# Patient Record
Sex: Male | Born: 1945 | Race: White | Hispanic: No | Marital: Single | Smoking: Never smoker
Health system: Southern US, Community
[De-identification: ages and names within clinical notes are randomized; demographics above are authoritative.]

## PROBLEM LIST (undated history)

## (undated) DIAGNOSIS — I10 Essential (primary) hypertension: Secondary | ICD-10-CM

## (undated) HISTORY — PX: KNEE SURGERY: SHX244

## (undated) HISTORY — DX: Essential (primary) hypertension: I10

---

## 1998-08-12 ENCOUNTER — Other Ambulatory Visit: Admission: RE | Admit: 1998-08-12 | Discharge: 1998-08-12 | Payer: Self-pay | Admitting: Orthopedic Surgery

## 2011-03-22 DIAGNOSIS — H524 Presbyopia: Secondary | ICD-10-CM | POA: Diagnosis not present

## 2011-11-16 DIAGNOSIS — L989 Disorder of the skin and subcutaneous tissue, unspecified: Secondary | ICD-10-CM | POA: Diagnosis not present

## 2011-11-16 DIAGNOSIS — E7800 Pure hypercholesterolemia, unspecified: Secondary | ICD-10-CM | POA: Diagnosis not present

## 2011-11-16 DIAGNOSIS — I10 Essential (primary) hypertension: Secondary | ICD-10-CM | POA: Diagnosis not present

## 2011-11-16 DIAGNOSIS — Z79899 Other long term (current) drug therapy: Secondary | ICD-10-CM | POA: Diagnosis not present

## 2011-11-16 DIAGNOSIS — Z23 Encounter for immunization: Secondary | ICD-10-CM | POA: Diagnosis not present

## 2011-11-16 DIAGNOSIS — Z1331 Encounter for screening for depression: Secondary | ICD-10-CM | POA: Diagnosis not present

## 2011-11-16 DIAGNOSIS — Z0000 Encounter for general adult medical examination without abnormal findings: Secondary | ICD-10-CM | POA: Diagnosis not present

## 2011-11-16 DIAGNOSIS — M1990 Unspecified osteoarthritis, unspecified site: Secondary | ICD-10-CM | POA: Diagnosis not present

## 2011-11-16 DIAGNOSIS — Z125 Encounter for screening for malignant neoplasm of prostate: Secondary | ICD-10-CM | POA: Diagnosis not present

## 2011-12-07 DIAGNOSIS — M19049 Primary osteoarthritis, unspecified hand: Secondary | ICD-10-CM | POA: Diagnosis not present

## 2011-12-30 DIAGNOSIS — R300 Dysuria: Secondary | ICD-10-CM | POA: Diagnosis not present

## 2012-02-16 DIAGNOSIS — C4359 Malignant melanoma of other part of trunk: Secondary | ICD-10-CM | POA: Diagnosis not present

## 2012-02-16 DIAGNOSIS — L570 Actinic keratosis: Secondary | ICD-10-CM | POA: Diagnosis not present

## 2012-02-16 DIAGNOSIS — L821 Other seborrheic keratosis: Secondary | ICD-10-CM | POA: Diagnosis not present

## 2012-02-16 DIAGNOSIS — D485 Neoplasm of uncertain behavior of skin: Secondary | ICD-10-CM | POA: Diagnosis not present

## 2012-02-16 DIAGNOSIS — C44319 Basal cell carcinoma of skin of other parts of face: Secondary | ICD-10-CM | POA: Diagnosis not present

## 2012-03-14 DIAGNOSIS — C4359 Malignant melanoma of other part of trunk: Secondary | ICD-10-CM | POA: Diagnosis not present

## 2012-03-14 DIAGNOSIS — D485 Neoplasm of uncertain behavior of skin: Secondary | ICD-10-CM | POA: Diagnosis not present

## 2012-03-21 DIAGNOSIS — C44319 Basal cell carcinoma of skin of other parts of face: Secondary | ICD-10-CM | POA: Diagnosis not present

## 2012-04-19 ENCOUNTER — Other Ambulatory Visit: Payer: Self-pay | Admitting: Geriatric Medicine

## 2012-04-19 DIAGNOSIS — R1310 Dysphagia, unspecified: Secondary | ICD-10-CM | POA: Diagnosis not present

## 2012-04-19 DIAGNOSIS — E782 Mixed hyperlipidemia: Secondary | ICD-10-CM | POA: Diagnosis not present

## 2012-04-19 DIAGNOSIS — I10 Essential (primary) hypertension: Secondary | ICD-10-CM | POA: Diagnosis not present

## 2012-04-19 DIAGNOSIS — Z79899 Other long term (current) drug therapy: Secondary | ICD-10-CM | POA: Diagnosis not present

## 2012-04-19 DIAGNOSIS — Z125 Encounter for screening for malignant neoplasm of prostate: Secondary | ICD-10-CM | POA: Diagnosis not present

## 2012-04-19 DIAGNOSIS — K219 Gastro-esophageal reflux disease without esophagitis: Secondary | ICD-10-CM | POA: Diagnosis not present

## 2012-04-19 DIAGNOSIS — Z0000 Encounter for general adult medical examination without abnormal findings: Secondary | ICD-10-CM | POA: Diagnosis not present

## 2012-04-19 DIAGNOSIS — Z1331 Encounter for screening for depression: Secondary | ICD-10-CM | POA: Diagnosis not present

## 2012-05-10 ENCOUNTER — Other Ambulatory Visit: Payer: Self-pay

## 2012-05-14 ENCOUNTER — Ambulatory Visit
Admission: RE | Admit: 2012-05-14 | Discharge: 2012-05-14 | Disposition: A | Discharge status: Home / Self Care | Payer: Medicare Other | Source: Ambulatory Visit | Attending: Geriatric Medicine | Admitting: Geriatric Medicine

## 2012-05-14 DIAGNOSIS — K449 Diaphragmatic hernia without obstruction or gangrene: Secondary | ICD-10-CM | POA: Diagnosis not present

## 2012-05-14 DIAGNOSIS — R1310 Dysphagia, unspecified: Secondary | ICD-10-CM

## 2012-07-05 DIAGNOSIS — D045 Carcinoma in situ of skin of trunk: Secondary | ICD-10-CM | POA: Diagnosis not present

## 2012-07-05 DIAGNOSIS — L821 Other seborrheic keratosis: Secondary | ICD-10-CM | POA: Diagnosis not present

## 2012-07-05 DIAGNOSIS — Q828 Other specified congenital malformations of skin: Secondary | ICD-10-CM | POA: Diagnosis not present

## 2012-07-05 DIAGNOSIS — D0460 Carcinoma in situ of skin of unspecified upper limb, including shoulder: Secondary | ICD-10-CM | POA: Diagnosis not present

## 2012-07-05 DIAGNOSIS — L570 Actinic keratosis: Secondary | ICD-10-CM | POA: Diagnosis not present

## 2012-07-05 DIAGNOSIS — Z85828 Personal history of other malignant neoplasm of skin: Secondary | ICD-10-CM | POA: Diagnosis not present

## 2012-07-05 DIAGNOSIS — D485 Neoplasm of uncertain behavior of skin: Secondary | ICD-10-CM | POA: Diagnosis not present

## 2012-07-05 DIAGNOSIS — Z85820 Personal history of malignant melanoma of skin: Secondary | ICD-10-CM | POA: Diagnosis not present

## 2012-10-18 DIAGNOSIS — E782 Mixed hyperlipidemia: Secondary | ICD-10-CM | POA: Diagnosis not present

## 2012-10-18 DIAGNOSIS — I10 Essential (primary) hypertension: Secondary | ICD-10-CM | POA: Diagnosis not present

## 2013-04-25 DIAGNOSIS — G479 Sleep disorder, unspecified: Secondary | ICD-10-CM | POA: Diagnosis not present

## 2013-04-25 DIAGNOSIS — Z23 Encounter for immunization: Secondary | ICD-10-CM | POA: Diagnosis not present

## 2013-04-25 DIAGNOSIS — Z1331 Encounter for screening for depression: Secondary | ICD-10-CM | POA: Diagnosis not present

## 2013-04-25 DIAGNOSIS — Z125 Encounter for screening for malignant neoplasm of prostate: Secondary | ICD-10-CM | POA: Diagnosis not present

## 2013-04-25 DIAGNOSIS — E7800 Pure hypercholesterolemia, unspecified: Secondary | ICD-10-CM | POA: Diagnosis not present

## 2013-04-25 DIAGNOSIS — Z79899 Other long term (current) drug therapy: Secondary | ICD-10-CM | POA: Diagnosis not present

## 2013-04-25 DIAGNOSIS — I10 Essential (primary) hypertension: Secondary | ICD-10-CM | POA: Diagnosis not present

## 2013-04-25 DIAGNOSIS — Z0000 Encounter for general adult medical examination without abnormal findings: Secondary | ICD-10-CM | POA: Diagnosis not present

## 2014-02-07 DIAGNOSIS — I10 Essential (primary) hypertension: Secondary | ICD-10-CM | POA: Diagnosis not present

## 2014-02-07 DIAGNOSIS — G4700 Insomnia, unspecified: Secondary | ICD-10-CM | POA: Diagnosis not present

## 2014-02-07 DIAGNOSIS — Z0000 Encounter for general adult medical examination without abnormal findings: Secondary | ICD-10-CM | POA: Diagnosis not present

## 2014-02-07 DIAGNOSIS — E780 Pure hypercholesterolemia: Secondary | ICD-10-CM | POA: Diagnosis not present

## 2014-02-07 DIAGNOSIS — Z125 Encounter for screening for malignant neoplasm of prostate: Secondary | ICD-10-CM | POA: Diagnosis not present

## 2014-07-09 DIAGNOSIS — D048 Carcinoma in situ of skin of other sites: Secondary | ICD-10-CM | POA: Diagnosis not present

## 2014-07-09 DIAGNOSIS — Z08 Encounter for follow-up examination after completed treatment for malignant neoplasm: Secondary | ICD-10-CM | POA: Diagnosis not present

## 2014-07-09 DIAGNOSIS — L579 Skin changes due to chronic exposure to nonionizing radiation, unspecified: Secondary | ICD-10-CM | POA: Diagnosis not present

## 2014-07-09 DIAGNOSIS — Z85820 Personal history of malignant melanoma of skin: Secondary | ICD-10-CM | POA: Diagnosis not present

## 2014-07-09 DIAGNOSIS — C4492 Squamous cell carcinoma of skin, unspecified: Secondary | ICD-10-CM | POA: Diagnosis not present

## 2014-07-30 DIAGNOSIS — C44399 Other specified malignant neoplasm of skin of other parts of face: Secondary | ICD-10-CM | POA: Diagnosis not present

## 2014-07-30 DIAGNOSIS — D0439 Carcinoma in situ of skin of other parts of face: Secondary | ICD-10-CM | POA: Diagnosis not present

## 2014-08-06 DIAGNOSIS — L570 Actinic keratosis: Secondary | ICD-10-CM | POA: Diagnosis not present

## 2014-08-06 DIAGNOSIS — L821 Other seborrheic keratosis: Secondary | ICD-10-CM | POA: Diagnosis not present

## 2014-08-06 DIAGNOSIS — Z85820 Personal history of malignant melanoma of skin: Secondary | ICD-10-CM | POA: Diagnosis not present

## 2014-08-06 DIAGNOSIS — L579 Skin changes due to chronic exposure to nonionizing radiation, unspecified: Secondary | ICD-10-CM | POA: Diagnosis not present

## 2014-08-06 DIAGNOSIS — Z08 Encounter for follow-up examination after completed treatment for malignant neoplasm: Secondary | ICD-10-CM | POA: Diagnosis not present

## 2014-09-17 DIAGNOSIS — L570 Actinic keratosis: Secondary | ICD-10-CM | POA: Diagnosis not present

## 2014-09-17 DIAGNOSIS — Z85828 Personal history of other malignant neoplasm of skin: Secondary | ICD-10-CM | POA: Diagnosis not present

## 2014-09-17 DIAGNOSIS — L578 Other skin changes due to chronic exposure to nonionizing radiation: Secondary | ICD-10-CM | POA: Diagnosis not present

## 2014-09-19 DIAGNOSIS — M1990 Unspecified osteoarthritis, unspecified site: Secondary | ICD-10-CM | POA: Diagnosis not present

## 2014-09-19 DIAGNOSIS — F411 Generalized anxiety disorder: Secondary | ICD-10-CM | POA: Diagnosis not present

## 2014-09-19 DIAGNOSIS — R413 Other amnesia: Secondary | ICD-10-CM | POA: Diagnosis not present

## 2014-09-19 DIAGNOSIS — G4700 Insomnia, unspecified: Secondary | ICD-10-CM | POA: Diagnosis not present

## 2014-11-07 DIAGNOSIS — M25519 Pain in unspecified shoulder: Secondary | ICD-10-CM | POA: Diagnosis not present

## 2014-11-07 DIAGNOSIS — Z79899 Other long term (current) drug therapy: Secondary | ICD-10-CM | POA: Diagnosis not present

## 2014-11-07 DIAGNOSIS — F329 Major depressive disorder, single episode, unspecified: Secondary | ICD-10-CM | POA: Diagnosis not present

## 2014-11-07 DIAGNOSIS — I10 Essential (primary) hypertension: Secondary | ICD-10-CM | POA: Diagnosis not present

## 2014-11-11 DIAGNOSIS — M25512 Pain in left shoulder: Secondary | ICD-10-CM | POA: Diagnosis not present

## 2014-11-15 DIAGNOSIS — M25512 Pain in left shoulder: Secondary | ICD-10-CM | POA: Diagnosis not present

## 2014-11-17 DIAGNOSIS — M25512 Pain in left shoulder: Secondary | ICD-10-CM | POA: Diagnosis not present

## 2014-12-04 DIAGNOSIS — R531 Weakness: Secondary | ICD-10-CM | POA: Diagnosis not present

## 2014-12-04 DIAGNOSIS — M25512 Pain in left shoulder: Secondary | ICD-10-CM | POA: Diagnosis not present

## 2014-12-08 DIAGNOSIS — M25512 Pain in left shoulder: Secondary | ICD-10-CM | POA: Diagnosis not present

## 2014-12-08 DIAGNOSIS — R531 Weakness: Secondary | ICD-10-CM | POA: Diagnosis not present

## 2014-12-11 DIAGNOSIS — R531 Weakness: Secondary | ICD-10-CM | POA: Diagnosis not present

## 2014-12-11 DIAGNOSIS — M25512 Pain in left shoulder: Secondary | ICD-10-CM | POA: Diagnosis not present

## 2014-12-15 DIAGNOSIS — R531 Weakness: Secondary | ICD-10-CM | POA: Diagnosis not present

## 2014-12-15 DIAGNOSIS — F329 Major depressive disorder, single episode, unspecified: Secondary | ICD-10-CM | POA: Diagnosis not present

## 2014-12-15 DIAGNOSIS — I10 Essential (primary) hypertension: Secondary | ICD-10-CM | POA: Diagnosis not present

## 2014-12-15 DIAGNOSIS — M25512 Pain in left shoulder: Secondary | ICD-10-CM | POA: Diagnosis not present

## 2014-12-15 DIAGNOSIS — M25519 Pain in unspecified shoulder: Secondary | ICD-10-CM | POA: Diagnosis not present

## 2015-01-05 DIAGNOSIS — M25512 Pain in left shoulder: Secondary | ICD-10-CM | POA: Diagnosis not present

## 2015-01-07 DIAGNOSIS — M25512 Pain in left shoulder: Secondary | ICD-10-CM | POA: Diagnosis not present

## 2015-01-07 DIAGNOSIS — R531 Weakness: Secondary | ICD-10-CM | POA: Diagnosis not present

## 2015-01-13 DIAGNOSIS — M25512 Pain in left shoulder: Secondary | ICD-10-CM | POA: Diagnosis not present

## 2015-01-13 DIAGNOSIS — R531 Weakness: Secondary | ICD-10-CM | POA: Diagnosis not present

## 2015-01-21 DIAGNOSIS — R531 Weakness: Secondary | ICD-10-CM | POA: Diagnosis not present

## 2015-01-21 DIAGNOSIS — M25512 Pain in left shoulder: Secondary | ICD-10-CM | POA: Diagnosis not present

## 2015-01-27 DIAGNOSIS — R531 Weakness: Secondary | ICD-10-CM | POA: Diagnosis not present

## 2015-01-27 DIAGNOSIS — M25512 Pain in left shoulder: Secondary | ICD-10-CM | POA: Diagnosis not present

## 2015-02-04 DIAGNOSIS — R531 Weakness: Secondary | ICD-10-CM | POA: Diagnosis not present

## 2015-02-04 DIAGNOSIS — M25512 Pain in left shoulder: Secondary | ICD-10-CM | POA: Diagnosis not present

## 2015-02-09 DIAGNOSIS — M25512 Pain in left shoulder: Secondary | ICD-10-CM | POA: Diagnosis not present

## 2015-02-17 ENCOUNTER — Ambulatory Visit (INDEPENDENT_AMBULATORY_CARE_PROVIDER_SITE_OTHER): Payer: Medicare Other | Admitting: Neurology

## 2015-02-17 ENCOUNTER — Other Ambulatory Visit: Payer: Self-pay | Admitting: *Deleted

## 2015-02-17 DIAGNOSIS — M6281 Muscle weakness (generalized): Secondary | ICD-10-CM

## 2015-02-17 DIAGNOSIS — G540 Brachial plexus disorders: Secondary | ICD-10-CM

## 2015-02-17 DIAGNOSIS — M6258 Muscle wasting and atrophy, not elsewhere classified, other site: Secondary | ICD-10-CM

## 2015-02-17 NOTE — Procedures (Signed)
[facility]  [address]  [place], [place] [address] Tel: [phone] Fax:  [phone] Test Date:  02/17/2015  Patient: Oscar Thompson DOB: 01/17/1946 Physician: Narda Amber, DO  Sex: Male Height: 5\' 6"  Ref Phys: Flossie Dibble, M.D.  ID#: [id number] Temp: 35.5C Technician: Jerilynn Mages. Dean   Patient Complaints: This is a 70 year-old gentleman referred for evaluation of proximal arm weakness on the left, concerning for axillary nerve injury.  NCV & EMG Findings: Extensive electrodiagnostic testing of the left upper extremity, thoracic paraspinal muscles (T7 and T11 levels), and additional studies of the right shows: 1. All sensory responses including the left median, ulnar, radial, medial and lateral antebrachial cutaneous sensory responses are within normal limits. 2. All motor responses are diffusely reduced in amplitude including the left median, ulnar (ADM and FDI), radial and bilateral axillary nerves. 3. There is severe active on chronic motor axon loss changes are seen affecting all of the tested muscles of the upper extremity on the left with prominent generalized atrophy of all the muscle groups. In comparison, there is chronic motor axon loss changes affecting all the muscles tested in the right upper extremity with fibrillations isolated to the pronator teres muscle. 4. Active motor axon loss changes are present in the thoracic paraspinal muscles at T7. 5. Fasciculation potentials are seen affecting 8 of the 18 tested muscles.  Impression: Extensive electrodiagnostic examination of the upper extremities and mid thoracic paraspinal muscles reveal active and chronic motor axon loss changes affecting virtually every muscle studied in the left upper extremity and thoracic paraspinal muscles. There is also evidence of a widespread fasciculation potentials.    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  Additional electrodiagnostic testing and neurological consultation is recommended.    ___________________________ Narda Amber, DO    Nerve Conduction Studies Anti Sensory Summary Table   Site NR Peak (ms) Norm Peak (ms) P-T Amp (V) Norm P-T Amp  Left Lat Ante Brach Cutan Anti Sensory (Lat Forearm)  35.5C  Lat Biceps    2.0 <2.8 12.8 >10  Left Med Ante Brach Cutan Anti Sensory (Med Forearm)  35.5C  Elbow    2.1  6.7   Left Median Anti Sensory (2nd Digit)  35.5C  Wrist    3.3 <3.8 10.9 >10  Left Radial Anti Sensory (Base 1st Digit)  35.5C  Wrist    2.1 <2.8 18.3 >10  Left Ulnar Anti Sensory (5th Digit)  35.5C  Wrist    3.0 <3.2 9.6 >5   Motor Summary Table   Site NR Onset (ms) Norm Onset (ms) O-P Amp (mV) Norm O-P Amp Site1 Site2 Delta-0 (ms) Dist (cm) Vel (m/s) Norm Vel (m/s)  Left Axillary Motor (Deltoid)  35.5C  Clavicle    5.5 <5.0 1.5 >3        Right Axillary Motor (Deltoid)  35.5C  Clavicle    5.8 <5.0 2.0 >3        Left Median Motor (Abd Poll Brev)  35.5C  Wrist    3.2 <4.0 2.9 >5 Elbow Wrist 5.7 29.0 51 >50  Elbow    8.9  2.9         Left Radial Motor (Ext Ind Prop)  35.5C  7cm    2.1 <3.1 2.2 >5 Up Arm 7cm 2.0 10.0 50 >50  Up Arm  4.1  2.1         Left Ulnar Motor (Abd Dig Minimi)  35.5C  Wrist    3.0 <3.1 4.3 >7 B Elbow Wrist 4.1 21.0 51 >50  B Elbow    7.1  3.9  A Elbow B Elbow 2.0 10.0 50 >50  A Elbow    9.1  3.9         Left Ulnar (FDI) Motor (1st DI)  35.5C  Wrist    4.3 <4.5 2.2 >7 Post-exercise Wrist 0.1 0.0  >50  Post-exercise    4.4  2.7          F Wave Studies   NR F-Lat (ms) Lat Norm (ms) L-R F-Lat (ms)  Left Ulnar (Mrkrs) (Abd Dig Min)  35.5C     31.50 <33    EMG   Side Muscle Ins Act Fibs Psw Fasc Number Recrt Dur Dur. Amp Amp. Poly Poly. Comment  Left 1stDorInt Nml 1+ Nml Nml 2- Rapid Many 1+  Many 1+ Nml Nml ATR  Left Abd Poll Brev Nml 1+ Nml Nml 3- Rapid Many 1+ Many 1+ Nml Nml ATR  Left ABD Dig Min Nml 1+ Nml Nml 3- Rapid All 1+ Many 1+ Nml Nml ATR  Left Ext Indicis Nml 1+ Nml Nml 3- Rapid Many 1+ Many 1+ Nml Nml ATR  Left PronatorTeres Nml 2+ Nml 1+ 2- Rapid Many 1+ Many 1+ Nml Nml ATR  Left Biceps Nml 2+ Nml 1+ 3- Rapid Most 1+ Most 1+ Some 1+ ATR  Left Triceps Nml 2+ Nml 2+ 2- Rapid Many 1+ Many 1+ Nml Nml ATR  Left Deltoid Nml 1+ Nml 1+ 3- Rapid All 1+ All 1+ All 1+ ATR  Left Infraspinatus Nml 3+ Nml 1+ NE None - - - - - - ATR  Left Thoracic Parasp Mid Nml 2+ Nml 1+ NE - - - - - - - ATR  Left Thoracic Parasp Low Nml Nml Nml Nml NE - - - - - - - ATR  Right 1stDorInt Nml Nml Nml Nml 2- Rapid Many 1+ Many 1+ Many Nml ATR  Right Abd Poll Brev Nml Nml Nml Nml 3- Rapid Many 1+ Some 1+ Nml Nml ATR  Right Ext Indicis Nml Nml Nml Nml 2- Rapid Many 1+ Many 1+ Nml Nml ATR  Right PronatorTeres Nml 2+ Nml 1+ 2- Rapid Some 1+ Some 1+ Nml Nml ATR  Right Biceps Nml Nml Nml Nml 2- Rapid Most 1+ Most 1+ Nml Nml ATR  Right Triceps Nml Nml Nml 3+ 3- Rapid Most 1+ Most 1+ Nml Nml ATR  Right Deltoid Nml Nml Nml Nml 3- Rapid Most 1+ Most 1+ Nml Nml ATR  Waveforms:

## 2015-02-23 ENCOUNTER — Ambulatory Visit (INDEPENDENT_AMBULATORY_CARE_PROVIDER_SITE_OTHER): Payer: Medicare Other | Admitting: Neurology

## 2015-02-23 ENCOUNTER — Encounter: Payer: Self-pay | Admitting: Neurology

## 2015-02-23 ENCOUNTER — Other Ambulatory Visit (INDEPENDENT_AMBULATORY_CARE_PROVIDER_SITE_OTHER): Payer: Medicare Other

## 2015-02-23 VITALS — BP 150/80 | HR 79 | Ht 63.0 in | Wt 138.2 lb

## 2015-02-23 DIAGNOSIS — R634 Abnormal weight loss: Secondary | ICD-10-CM

## 2015-02-23 DIAGNOSIS — R531 Weakness: Secondary | ICD-10-CM | POA: Diagnosis not present

## 2015-02-23 DIAGNOSIS — G1220 Motor neuron disease, unspecified: Secondary | ICD-10-CM | POA: Diagnosis not present

## 2015-02-23 DIAGNOSIS — G8331 Monoplegia, unspecified affecting right dominant side: Secondary | ICD-10-CM | POA: Diagnosis not present

## 2015-02-23 DIAGNOSIS — M6250 Muscle wasting and atrophy, not elsewhere classified, unspecified site: Secondary | ICD-10-CM | POA: Diagnosis not present

## 2015-02-23 DIAGNOSIS — G8334 Monoplegia, unspecified affecting left nondominant side: Secondary | ICD-10-CM

## 2015-02-23 DIAGNOSIS — R253 Fasciculation: Secondary | ICD-10-CM

## 2015-02-23 LAB — COMPREHENSIVE METABOLIC PANEL
ALT: 13 U/L (ref 9–46)
AST: 16 U/L (ref 10–35)
Albumin: 3.7 g/dL (ref 3.6–5.1)
Alkaline Phosphatase: 58 U/L (ref 40–115)
BUN: 14 mg/dL (ref 7–25)
CHLORIDE: 101 mmol/L (ref 98–110)
CO2: 29 mmol/L (ref 20–31)
Calcium: 8.8 mg/dL (ref 8.6–10.3)
Creat: 0.53 mg/dL — ABNORMAL LOW (ref 0.70–1.18)
Glucose, Bld: 89 mg/dL (ref 65–99)
POTASSIUM: 4.7 mmol/L (ref 3.5–5.3)
SODIUM: 139 mmol/L (ref 135–146)
TOTAL PROTEIN: 6.2 g/dL (ref 6.1–8.1)
Total Bilirubin: 0.3 mg/dL (ref 0.2–1.2)

## 2015-02-23 LAB — CK: CK TOTAL: 272 U/L — AB (ref 7–232)

## 2015-02-23 LAB — VITAMIN B12: Vitamin B-12: 858 pg/mL (ref 211–911)

## 2015-02-23 LAB — TSH: TSH: 2.68 u[IU]/mL (ref 0.35–4.50)

## 2015-02-23 NOTE — Progress Notes (Signed)
[facility] Clinic Note - Initial Visit   Date: 02/23/2015  Oscar Thompson MRN: [id number] DOB: 09-26-1945   Dear Dr. French Ana:  Thank you for your kind referral of Oscar Thompson for consultation of bilateral arm weakness and atrophy. Although his history is well known to you, please allow [place] to reiterate it for the purpose of our medical record. The patient was accompanied to the clinic by self.   History of Present Illness: Oscar Thompson is a 70 y.o. right-handed Caucasian male with hypertension, skin cancer, arthritis, GERD, and depression presenting for evaluation of generalized muscle atrophy and arm weakness.    He has always been very active and has his own [place] and previously used to teach martial arts and enjoys dancing the [place].  Starting around summer 2016, he began noticing weakness when trying to lift objects and raise his arm above his head.  He has never has any pain.   Since there, there has been a gradual worsening of weakness and during the holidays began noticing similar symptoms in the right arm.   He takes potassium for muscle cramps.  He endorses weakness of the legs such as that he is unable to climb stairs as effortlessly as previously.  He has lost 35lb over the past 29-month.  Since early 2016, he recalls having difficulty swallowing solids and takes omeprazole for GERD which helps.  He has noticed muscle twitches over his arms, but they are not bothersome.  He denies any shortness of breath and sleeps on one pillow.   He was seeing Dr. CFrench Anafor shoulder weakness and had MRI shoulder which did not explain the severity of symptoms, so referred to me for NCS/EMG.  Electrodiagnostic testing showed severe active on chronic motor axon loss changes, concerning for disorder of anterior horn cells.  He is here for further evaluation and management.    Over last week, he has noticed progressively worsening weakness of  the arms, such that and now he is able to feed himself because of inability to bring the utensils to his mouth.  Activities such as cooking, shaving, brushing teeth, and dressing are all much more challenging.       Out-side paper records, electronic medical record, and images have been reviewed where available and summarized as:  NCS/EMG of the upper extremities 02/22/2014: Extensive electrodiagnostic examination of the upper extremities and mid thoracic paraspinal muscles reveal active and chronic motor axon loss changes affecting virtually every muscle studied in the left upper extremity and thoracic paraspinal muscles. There is also evidence of a widespread fasciculation potentials.   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  Additional electrodiagnostic testing and neurological consultation is recommended.    Past Medical History  Diagnosis Date  . Hypertension     Past Surgical History  Procedure Laterality Date  . Knee surgery       Medications:  Outpatient Encounter Prescriptions as of 02/23/2015  Medication Sig  . aspirin-acetaminophen-caffeine (EXCEDRIN MIGRAINE) 250-250-65 MG tablet Take by mouth every 6 (six) hours as needed for headache.  . citalopram (CELEXA) 10 MG tablet Take 10 mg by mouth daily.  . diclofenac (VOLTAREN) 25 MG EC tablet Take 25 mg by mouth 2 (two) times daily.  .Marland KitchenLOSARTAN POTASSIUM-HCTZ PO Take by mouth.   No facility-administered encounter medications on file  as of 02/23/2015.     Allergies:  Allergies  Allergen Reactions  . Sulfa Antibiotics     Family History: Family History  Problem Relation Age of Onset  . CAD Mother     Deceased  . Transient ischemic attack Mother   . Stroke Mother   . Diabetes Mellitus II Father   . Arthritis  Sister   . Healthy Daughter     x 2    Social History: Social History  Substance Use Topics  . Smoking status: Never Smoker   . Smokeless tobacco: Never Used  . 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  He has a small handyman businees.   Lives alone in three story home.   Has 2 daughters.     Recently got out of a relationship.    Review of Systems:  CONSTITUTIONAL: No fevers, chills, night sweats, + 30 lb weight loss.   EYES: No visual changes or eye pain ENT: No hearing changes.  No history of nose bleeds.   RESPIRATORY: No cough, wheezing and shortness of breath.   CARDIOVASCULAR: Negative for chest pain, and palpitations.   GI: Negative for abdominal discomfort, blood in stools or black stools.  No recent change in bowel habits.   GU:  No history of incontinence.   MUSCLOSKELETAL: No history of joint pain or swelling.  No myalgias.   SKIN: Negative for lesions, rash, and itching.   HEMATOLOGY/ONCOLOGY: Negative for prolonged bleeding, bruising easily, and swollen nodes.  No history of cancer.   ENDOCRINE: Negative for cold or heat intolerance, polydipsia or goiter.   PSYCH:  +depression or anxiety symptoms.   NEURO: As Above.   Vital Signs:  BP 150/80 mmHg  Pulse 79  Wt 138 lb 4 oz (62.71 kg)  SpO2 96% Pain Scale: 0 on a scale of 0-10   General Medical Exam:   General:  Thin appearing, comfortable.   Eyes/ENT: see cranial nerve examination.   Neck: No masses appreciated.  Full range of motion without tenderness.  No carotid bruits. Respiratory:  Clear to auscultation, good air entry bilaterally.   Cardiac:  Regular rate and rhythm, no murmur.   Extremities:  No deformities, edema, or skin discoloration.  Skin:  No rashes or lesions.  Neurological Exam: MENTAL STATUS including orientation to time, place, person, recent and remote memory, attention span and concentration, language, and fund of  knowledge is normal.  Speech is not dysarthric.  CRANIAL NERVES: II:  No visual field defects.  Unremarkable fundi.   III-IV-VI: Pupils equal round and reactive to light.  Normal conjugate, extra-ocular eye movements in all directions of gaze.  No nystagmus.  No ptosis. V:  Normal facial sensation.  Jaw jerk is absent.   VII:  Normal facial symmetry and movements.  Mild weakness with buccinators. No pathologic facial reflexes.  VIII:  Normal hearing and vestibular function.   IX-X:  Normal palatal movement.   XI:  Normal shoulder shrug and head rotation.   XII:  Normal tongue strength and range of motion, no deviation.  Tongue fasciculations are present.  MOTOR:  Significant atrophy of the arms, especially shoulder girdle (L>R) and wasting of the thoracic muscles.  Active fasciculations over the proximal arm muscles, back, and rare in the lower legs.  No pronator drift.  Tone is normal.    Right Upper Extremity:    Left Upper Extremity:    Deltoid  3/5   Deltoid  3-/5   Biceps  3/5   Biceps  3-/5   Triceps  4/5   Triceps  3+/5   Wrist extensors  3/5   Wrist extensors  3-/5   Wrist flexors  5/5   Wrist flexors  4/5   Finger extensors  3/5   Finger extensors  3/5   Finger flexors  5-/5   Finger flexors  4/5   Dorsal interossei  4/5   Dorsal interossei  3/5   Abductor pollicis  4/5   Abductor pollicis  3/5   Tone (Ashworth scale)  0  Tone (Ashworth scale)  0   Right Lower Extremity:    Left Lower Extremity:    Hip flexors  5-/5   Hip flexors  5-/5   Hip extensors  5/5   Hip extensors  5/5   Knee flexors  5/5   Knee flexors  4+/5   Knee extensors  5/5   Knee extensors  5/5   Dorsiflexors  5/5   Dorsiflexors  5/5   Plantarflexors  5/5   Plantarflexors  5/5   Toe extensors  5/5   Toe extensors  5/5   Toe flexors  5/5   Toe flexors  5/5   Tone (Ashworth scale)  0  Tone (Ashworth scale)  0   MSRs:  Right                                                                  Left brachioradialis 2+  brachioradialis 2+  biceps 2+  biceps 2+  triceps 2+  triceps 2+  patellar 3+  patellar 3+  ankle jerk 2+  ankle jerk 2+  Hoffman no  Hoffman no  plantar response down  plantar response down  Crossed adductor present bilaterally  SENSORY:  Normal and symmetric perception of light touch, pinprick, vibration, and proprioception.   COORDINATION/GAIT: Normal finger-to- nose-finger and heel-to-shin.  Intact rapid alternating movements bilaterally.  Able to rise from a chair without using arms.  Gait narrow based and stable. Tandem and stressed gait intact.    IMPRESSION: Mr. Middlebrooks is a 70 year-old gentleman referred for evaluation of painless muscle weakness, atrophy, and fasciculations, which has been progressive since fall of 2016. 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 Need to exclude secondary causes of extrinsic injury to the motor nerves such as nutrient deficiency, paraproteinemias, and Lyme disease. Imaging of the brain and cervical spine will be ordered to evaluation of structural pathology affecting the upper motor neurons, but my overall suspicion is low. Finally, NCS/EMG will be performed of the lower extremity to assess the degree of involvement. I had a lengthy discussion about the diagnostic possibilities, including amyotrophic lateral sclerosis, but that it is too early to make any definitive diagnosis without further investigation.     PLAN/RECOMMENDATIONS:  1.  Check CK, TSH, copper, aldolase, SPEP with IFE, Lyme, vitamin B12, ESR 2.  MRI brain and cervical spine wo contrast 3.  NCS/EMG of the legs 4.  Home PT/OT declined  Return to clinic 4 weeks   The duration of this appointment visit was 60 minutes of face-to-face time with the patient.  Greater than 50% of this time was spent  in counseling, explanation of diagnosis, planning of further management, and coordination of care.   Thank you for  allowing me to participate in patient's care.  If I can answer any additional questions, I would be pleased to do so.    Sincerely,    Milia Warth K. Posey Pronto, DO

## 2015-02-23 NOTE — Progress Notes (Signed)
Note routed to both. 

## 2015-02-23 NOTE — Patient Instructions (Addendum)
1.  MRI brain and cervical spine 2.  NCS/EMG of the legs 3.  Check blood work 4.  Return to clinic after above testing

## 2015-02-24 LAB — LYME AB/WESTERN BLOT REFLEX: B BURGDORFERI AB IGG+ IGM: 0.28 {ISR}

## 2015-02-25 LAB — PROTEIN ELECTROPHORESIS, SERUM
ALBUMIN ELP: 3.5 g/dL — AB (ref 3.8–4.8)
ALPHA-1-GLOBULIN: 0.4 g/dL — AB (ref 0.2–0.3)
Alpha-2-Globulin: 0.9 g/dL (ref 0.5–0.9)
Beta 2: 0.4 g/dL (ref 0.2–0.5)
Beta Globulin: 0.4 g/dL (ref 0.4–0.6)
Gamma Globulin: 0.7 g/dL — ABNORMAL LOW (ref 0.8–1.7)
TOTAL PROTEIN, SERUM ELECTROPHOR: 6.2 g/dL (ref 6.1–8.1)

## 2015-02-25 LAB — ALDOLASE: ALDOLASE: 4.9 U/L (ref <=8.1)

## 2015-02-25 LAB — IMMUNOFIXATION ELECTROPHORESIS
IGA: 249 mg/dL (ref 68–379)
IGG (IMMUNOGLOBIN G), SERUM: 700 mg/dL (ref 650–1600)
IgM, Serum: 36 mg/dL — ABNORMAL LOW (ref 41–251)

## 2015-02-26 ENCOUNTER — Other Ambulatory Visit: Payer: Medicare Other

## 2015-02-26 ENCOUNTER — Ambulatory Visit
Admission: RE | Admit: 2015-02-26 | Discharge: 2015-02-26 | Disposition: A | Discharge status: Home / Self Care | Payer: Medicare Other | Source: Ambulatory Visit | Attending: Neurology | Admitting: Neurology

## 2015-02-26 DIAGNOSIS — R253 Fasciculation: Secondary | ICD-10-CM

## 2015-02-26 DIAGNOSIS — M50222 Other cervical disc displacement at C5-C6 level: Secondary | ICD-10-CM | POA: Diagnosis not present

## 2015-02-26 DIAGNOSIS — R634 Abnormal weight loss: Secondary | ICD-10-CM

## 2015-02-26 DIAGNOSIS — R531 Weakness: Secondary | ICD-10-CM

## 2015-02-26 DIAGNOSIS — G8331 Monoplegia, unspecified affecting right dominant side: Secondary | ICD-10-CM

## 2015-02-26 DIAGNOSIS — G8334 Monoplegia, unspecified affecting left nondominant side: Secondary | ICD-10-CM

## 2015-02-26 DIAGNOSIS — M6250 Muscle wasting and atrophy, not elsewhere classified, unspecified site: Secondary | ICD-10-CM

## 2015-02-26 DIAGNOSIS — G1220 Motor neuron disease, unspecified: Secondary | ICD-10-CM

## 2015-02-26 DIAGNOSIS — M50221 Other cervical disc displacement at C4-C5 level: Secondary | ICD-10-CM | POA: Diagnosis not present

## 2015-02-26 DIAGNOSIS — M50223 Other cervical disc displacement at C6-C7 level: Secondary | ICD-10-CM | POA: Diagnosis not present

## 2015-02-26 LAB — COPPER, SERUM: Copper: 136 ug/dL (ref 70–175)

## 2015-02-26 LAB — IMMUNOFIXATION INTE

## 2015-02-27 ENCOUNTER — Other Ambulatory Visit: Payer: Medicare Other

## 2015-03-02 LAB — PROTEIN ELECTROPHORESIS,RANDOM URN
ALBUMIN UR 24 HR ELECTRO: 13.8 %
ALPHA-1-GLOBULIN, U: 33 %
Alpha-2-Globulin, U: 31 %
BETA GLOBULIN, U: 14.7 %
CREATININE, URINE: 110 mg/dL (ref 20–370)
GAMMA GLOBULIN, U: 7.5 %
Protein Creatinine Ratio: 373 mg/g creat — ABNORMAL HIGH (ref 22–128)
Total Protein, Urine: 41 mg/dL — ABNORMAL HIGH (ref 5–25)

## 2015-03-03 ENCOUNTER — Ambulatory Visit (INDEPENDENT_AMBULATORY_CARE_PROVIDER_SITE_OTHER): Payer: Medicare Other | Admitting: Neurology

## 2015-03-03 DIAGNOSIS — G8334 Monoplegia, unspecified affecting left nondominant side: Secondary | ICD-10-CM

## 2015-03-03 DIAGNOSIS — R531 Weakness: Secondary | ICD-10-CM

## 2015-03-03 DIAGNOSIS — R253 Fasciculation: Secondary | ICD-10-CM

## 2015-03-03 DIAGNOSIS — G8331 Monoplegia, unspecified affecting right dominant side: Secondary | ICD-10-CM

## 2015-03-03 DIAGNOSIS — G1220 Motor neuron disease, unspecified: Secondary | ICD-10-CM

## 2015-03-03 DIAGNOSIS — M6250 Muscle wasting and atrophy, not elsewhere classified, unspecified site: Secondary | ICD-10-CM

## 2015-03-03 DIAGNOSIS — R634 Abnormal weight loss: Secondary | ICD-10-CM

## 2015-03-03 NOTE — Procedures (Signed)
[facility]  [address], [address]  [place], [place] [address] Tel: [phone] Fax:  [phone] Test Date:  03/03/2015  Patient: Oscar Thompson DOB: 25-Jul-1945 Physician: Narda Amber, DO  Sex: Male Height: 5\' 6"  Ref Phys: Narda Amber, DO  ID#: [id number] Temp: 33.2C Technician: Jerilynn Mages. Dean   Patient Complaints: This is a 70 year old gentleman referred for evaluation of progressive generalized weakness and fasciculations.  NCV & EMG Findings: Extensive electrodiagnostic testing of the left lower extremity, right lower extremity and bulbar muscles shows: 1. Bilateral sural and superficial peroneal sensory responses are within normal limits. 2. Bilateral peroneal and tibial motor responses are within normal limits. 3. Bilateral H reflex studies are within normal limits. 4. Active on chronic motor axon loss changes are seen affecting the rectus femoris and adductor longus muscles on the left. These findings are not seen in the right lower extremity.  5. Fasciculation potentials are seen affecting the lumbar paraspinal muscles, rectus femoris, abductor longus, and mentalis muscles on the left. 6. There is no evidence of active or chronic motor axon loss changes affecting the hyoglossus and mentalis muscles.  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 Overall, these findings are mild in degree electrically.   ___________________________ Narda Amber, DO    Nerve Conduction Studies Anti Sensory Summary Table   Site NR Peak (ms) Norm Peak (ms) P-T Amp (V) Norm P-T Amp  Left Sup Peroneal Anti Sensory (Ant Lat Mall)  33.2C  12 cm    2.6 <4.6 11.5 >3  Right Sup Peroneal Anti Sensory (Ant Lat Mall)  12 cm    4.2 <4.6 8.1 >3  Left Sural Anti Sensory (Lat Mall)  Calf    3.9 <4.6 6.4 >3  Right Sural Anti  Sensory (Lat Mall)  Calf    3.8 <4.6 3.5 >3   Motor Summary Table   Site NR Onset (ms) Norm Onset (ms) O-P Amp (mV) Norm O-P Amp Site1 Site2 Delta-0 (ms) Dist (cm) Vel (m/s) Norm Vel (m/s)  Left Peroneal Motor (Ext Dig Brev)  33.2C  Ankle    3.8 <6.0 4.2 >2.5 B Fib Ankle 6.2 30.0 48 >40  B Fib    10.0  3.5  Poplt B Fib 2.1 10.0 48 >40  Poplt    12.1  3.3         Right Peroneal Motor (Ext Dig Brev)  Ankle    4.5 <6.0 5.1 >2.5 B Fib Ankle 7.4 31.0 42 >40  B Fib    11.9  4.5  Poplt B Fib 2.2 10.0 45 >40  Poplt    14.1  4.4         Left Tibial Motor (Abd Hall Brev)  33.2C  Ankle    4.5 <6.0 4.8 >4 Knee Ankle 8.5 38.0 45 >40  Knee    13.0  4.6         Right Tibial Motor (Abd Hall Brev)  Ankle    4.0 <6.0 5.1 >4 Knee Ankle 9.6 40.0 42 >40  Knee    13.6  3.4           H Reflex Studies  NR H-Lat (ms) Lat Norm (ms) L-R H-Lat (ms)  Left Tibial (Gastroc)  33.2C     34.01 <35 0.96  Right Tibial (Gastroc)     34.97 <35 0.96   EMG  Side Muscle Ins Act Fibs Psw Fasc Number Recrt Dur Dur. Amp Amp. Poly Poly. Comment  Right RectFemoris Nml Nml Nml Nml Nml Nml Nml Nml Nml Nml Nml Nml N/A  Right GluteusMed Nml Nml Nml Nml Nml Nml Nml Nml Nml Nml Nml Nml N/A  Right Lumbo Parasp Low Nml Nml Nml 1+ Nml - - - - - - - N/A  Right AntTibialis Nml Nml Nml Nml Nml Nml Nml Nml Nml Nml Nml Nml N/A  Right Gastroc Nml Nml Nml Nml Nml Nml Nml Nml Nml Nml Nml Nml N/A  Left RectFemoris Nml 1+ Nml 1+ 1- Rapid Some 1+ Some 1+ Some Nml N/A  Left AntTibialis Nml Nml Nml Nml Nml Nml Nml Nml Nml Nml Nml Nml N/A  Left Gastroc Nml Nml Nml Nml Nml Nml Nml Nml Nml Nml Nml Nml N/A  Left Flex Dig Long Nml Nml Nml Nml Nml Nml Nml Nml Nml Nml Nml Nml N/A  Left GluteusMed Nml Nml Nml Nml Nml Nml Nml Nml Nml Nml Nml Nml N/A  Left AdductorLong Nml 1+ Nml 1+ 1- Rapid Some 1+ Some 1+ Nml Nml N/A  Left Mentalis Nml Nml Nml 1+ Nml Nml Nml Nml Nml Nml Nml Nml N/A  Left Hyoglossus Nml Nml Nml Nml Nml Nml Nml Nml Nml Nml Nml  Nml N/A      Waveforms:

## 2015-03-05 ENCOUNTER — Other Ambulatory Visit (INDEPENDENT_AMBULATORY_CARE_PROVIDER_SITE_OTHER): Payer: Medicare Other

## 2015-03-05 ENCOUNTER — Encounter: Payer: Self-pay | Admitting: Neurology

## 2015-03-05 ENCOUNTER — Ambulatory Visit (INDEPENDENT_AMBULATORY_CARE_PROVIDER_SITE_OTHER): Payer: Medicare Other | Admitting: Neurology

## 2015-03-05 VITALS — BP 122/62 | HR 62 | Ht 66.0 in | Wt 139.0 lb

## 2015-03-05 DIAGNOSIS — R253 Fasciculation: Secondary | ICD-10-CM

## 2015-03-05 DIAGNOSIS — G1221 Amyotrophic lateral sclerosis: Secondary | ICD-10-CM

## 2015-03-05 DIAGNOSIS — R531 Weakness: Secondary | ICD-10-CM

## 2015-03-05 DIAGNOSIS — M6250 Muscle wasting and atrophy, not elsewhere classified, unspecified site: Secondary | ICD-10-CM | POA: Diagnosis not present

## 2015-03-05 LAB — COMPREHENSIVE METABOLIC PANEL
ALBUMIN: 3.8 g/dL (ref 3.5–5.2)
ALT: 13 U/L (ref 0–53)
AST: 16 U/L (ref 0–37)
Alkaline Phosphatase: 64 U/L (ref 39–117)
BILIRUBIN TOTAL: 0.4 mg/dL (ref 0.2–1.2)
BUN: 15 mg/dL (ref 6–23)
CALCIUM: 9 mg/dL (ref 8.4–10.5)
CHLORIDE: 103 meq/L (ref 96–112)
CO2: 32 meq/L (ref 19–32)
CREATININE: 0.54 mg/dL (ref 0.40–1.50)
GFR: 159.78 mL/min (ref >60.00)
Glucose, Bld: 95 mg/dL (ref 70–99)
Potassium: 4.2 mEq/L (ref 3.5–5.1)
SODIUM: 140 meq/L (ref 135–145)
Total Protein: 6.6 g/dL (ref 6.0–8.3)

## 2015-03-05 LAB — CBC
HCT: 40.1 % (ref 39.0–52.0)
Hemoglobin: 13.5 g/dL (ref 13.0–17.0)
MCHC: 33.7 g/dL (ref 30.0–36.0)
MCV: 93.9 fl (ref 78.0–100.0)
PLATELETS: 309 10*3/uL (ref 150.0–400.0)
RBC: 4.27 Mil/uL (ref 4.22–5.81)
RDW: 13.2 % (ref 11.5–15.5)
WBC: 8.5 10*3/uL (ref 4.0–10.5)

## 2015-03-05 MED ORDER — RILUZOLE 50 MG PO TABS: ORAL_TABLET | ORAL | Status: AC

## 2015-03-05 NOTE — Progress Notes (Signed)
Follow-up Visit   Date: 03/05/2015    KIRILL BETIT MRN: [id number] DOB: 1945-07-24   Interim History: Oscar Thompson is a 70 y.o. right-handed Caucasian male with hypertension, skin cancer, arthritis, GERD, and depression returning to the clinic for follow-up of motor neuron disease.  The patient was accompanied to the clinic by self.  History of present illness: INITIAL VISIT 02/22/2014:  He has always been very active and has his own handyman business and previously used to teach martial arts and enjoys dancing the [place]. Starting around summer 2016, he began noticing weakness when trying to lift objects and raise his arm above his head. He has never has any pain. Since there, there has been a gradual worsening of weakness and during the holidays began noticing similar symptoms in the right arm. He takes potassium for muscle cramps. He endorses weakness of the legs such as that he is unable to climb stairs as effortlessly as previously. He has lost 35lb over the past 30-months. Since early 2016, he recalls having difficulty swallowing solids and takes omeprazole for GERD which helps. He has noticed muscle twitches over his arms, but they are not bothersome.  He was seeing Dr. French Ana for shoulder weakness and had MRI shoulder which did not explain the severity of symptoms, so referred to me for NCS/EMG. Electrodiagnostic testing showed severe active on chronic motor axon loss changes, concerning for disorder of anterior horn cells.   During the end of January, he has noticed progressively worsening weakness of the arms, such that and now he is able to feed himself because of inability to bring the utensils to his mouth. Activities such as cooking, shaving, brushing teeth, and dressing are all much more challenging.  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  UPDATE 03/05/2015:  Since his last visit, he has been  creative at home trying to find ways to manage his weakness, such as using a stool to place things in the microwave and putting items at waist level, as to avoid reaching overhead.  Denies any pain, shortness of breath, dysarthria, and dysphagia.  He is here to discuss the results of his MRI brain, MRI cervical spine, labs, and NCS/EMG.   He reports having a fall in Spring 2016 striking the cement on right side without loss of consciousness.   Medications:  Current Outpatient Prescriptions on File Prior to Visit  Medication Sig Dispense Refill  . aspirin-acetaminophen-caffeine (EXCEDRIN MIGRAINE) 250-250-65 MG tablet Take by mouth every 6 (six) hours as needed for headache.    . citalopram (CELEXA) 10 MG tablet Take 10 mg by mouth daily.    . diclofenac (VOLTAREN) 25 MG EC tablet Take 25 mg by mouth 2 (two) times daily.    Marland Kitchen LOSARTAN POTASSIUM-HCTZ PO Take by mouth.     No current facility-administered medications on file prior to visit.    Allergies:  Allergies  Allergen Reactions  . Sulfa Antibiotics     Review of Systems:  CONSTITUTIONAL: No fevers, chills, night sweats, +30 lb weight loss.  EYES: No visual changes or eye pain ENT: No hearing changes.  No history of nose bleeds.   RESPIRATORY: No cough, wheezing and shortness of breath.   CARDIOVASCULAR: Negative for chest pain, and palpitations.   GI: Negative for abdominal discomfort, blood in stools or black stools.  No recent change in bowel habits.   GU:  No history of incontinence.   MUSCLOSKELETAL: No  history of joint pain or swelling.  No myalgias.   SKIN: Negative for lesions, rash, and itching.   ENDOCRINE: Negative for cold or heat intolerance, polydipsia or goiter.   PSYCH:  + depression or anxiety symptoms.   NEURO: As Above.   Vital Signs:  BP 122/62 mmHg  Pulse 62  Ht 5\' 6"  (1.676 m)  Wt 139 lb (63.05 kg)  BMI 22.45 kg/m2  Neurological Exam: MENTAL STATUS including orientation to time, place, person,  recent and remote memory, attention span and concentration, language, and fund of knowledge is normal.  Speech is not dysarthric.  CRANIAL NERVES:  No visual field defects. Pupils equal round and reactive to light.  Normal conjugate, extra-ocular eye movements in all directions of gaze.  No ptosis. Normal facial sensation.  Face is symmetric. Palate elevates symmetrically.  Tongue is midline.Tongue fasciculations are present, but there is no tongue weakness.  Bilateral palmomental reflexes.  No snout.  Myerson's sign is present.  MOTOR:  Prominent atrophy of the arms, especially shoulder girdle (L>R) and wasting of the thoracic muscles. Active fasciculations over the proximal arm muscles, back, and rare in the lower legs  Right Upper Extremity:    Left Upper Extremity:    Deltoid  3/5   Deltoid  3-/5   Biceps  3/5   Biceps  3-/5   Triceps  4/5   Triceps  3+/5   Wrist extensors  3/5   Wrist extensors  3-/5   Wrist flexors  4/5   Wrist flexors  4/5   Finger extensors  3/5   Finger extensors  3/5   Finger flexors  4/5   Finger flexors  4/5   Dorsal interossei  4/5   Dorsal interossei  3/5   Abductor pollicis  4/5   Abductor pollicis  3/5   Tone (Ashworth scale)  0  Tone (Ashworth scale)  0   Right Lower Extremity:    Left Lower Extremity:    Hip flexors  5/5   Hip flexors  5-/5   Hip extensors  5/5   Hip extensors  4+/5   Knee flexors  5-/5   Knee flexors  4+/5   Knee extensors  5/5   Knee extensors  5/5   Dorsiflexors  5/5   Dorsiflexors  5/5   Plantarflexors  5/5   Plantarflexors  5/5   Toe extensors  5/5   Toe extensors  5/5   Toe flexors  5/5   Toe flexors  5/5   Tone (Ashworth scale)  0  Tone (Ashworth scale)  0    MSRs:  Reflexes are 2+/4 throughout, except 3+ patella jerks bilaterally.  Crossed adductors present bilaterally.  SENSORY:  Intact to vibration throughout  COORDINATION/GAIT:   Gait narrow based and stable.   Data: Labs 02/23/2015:  TSH 2.68, copper 136, Lyme neg,  vitamin B12 858, SPEP/UPEP with IFE no M protein, CK 272*, aldolase 4.9  NCS/EMG of the upper extremities 02/22/2014: Extensive electrodiagnostic examination of the upper extremities and mid thoracic paraspinal muscles reveal active and chronic motor axon loss changes affecting virtually every muscle studied in the left upper extremity and thoracic paraspinal muscles. There is also evidence of a widespread fasciculation potentials.   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  NCS/EMG of the lower extremities 03/02/2014: The electrophysiologic findings are suggestive of  an active on chronic intraspinal canal lesion process (i.e. radiculopathy, disorder of anterior horn cells) affecting the L2-3 nerve roots/segments. Overall, these findings are mild in degree electrically.  MRI brain and cervical spine 02/25/2014:  No acute infarct or intracranial hemorrhage.  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 Mild chronic small vessel disease changes.  Anterior right frontal lobe abnormality has an appearance of encephalomalacia possibly related to prior trauma rather than remote infarct.  Global atrophy without hydrocephalus.  Decreased signal intensity of bone marrow may be related to patient's habitus. Correlation with CBC to exclude anemia contributing to this appearance may be considered.  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  IMPRESSION/PLAN: Mr. Weldin is a 70 year-old gentleman returning for evaluation of progressively worsening bilateral arm weakness, atrophy, and fasciculations since the fall of 2016.  His neurological exam and electrodiagnostic testing shows lower motor neuron  dysfunction involving the cervical, thoracic, and lumbosacral regions.  No active or chronic motor axon loss changes were seen in the bulbar muscles, but his exam shows some upper motor neuron findings.    I have reviewed his imaging, labs, and EDX with patient. Laboratory testing has excluded injury to the motor nerves such as nutrient deficiency, paraproteinemias, and Lyme disease. Imaging of the brain shows remote subcortical infarcts and right anterior frontal encephalomalacia, but patient denies have any known history of stroke.  He has been very active in his life with some instances of head trauma, but no loss of consciousness.  Nevertheless, it does not explain his current symptoms. His cervical spine shows some degenerative changes of the cervical spine without severe spinal or foraminal stenosis.    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  I had an extensive discussion with the patient regarding the diagnosis, pathogenesis, prognosis and management options.  I offered rilutek which is the only FDA-approved medication for ALS, which he would like to start.  With his upper extremity paraparesis, I strongly encouraged home occupational therapy, but he is very independent and wants to do his own exercises and urged him to be cautious and to exercise within his current limitations.    He had many questions which I answered to the best of my ability.   PLAN/RECOMMENDATIONS:  1.  Start riluzole 50mg  - take 1 tablet daily for one week, then increase to 1 tablet in the morning and bedtime 2.  Check CBC and CMP monthly for the first three months, then every 3 months 3.  Occupational therapy declined, he prefers to do his own home exercises 4.  Referral to [facility] for second opinion and interest in clinical trials  Return to clinic in 2-3 months  The duration of this appointment visit was 50 minutes of  face-to-face time with the patient.  Greater than 50% of this time was spent in counseling, explanation of diagnosis, planning of further management, and coordination of care.   Thank you for allowing me to participate in patient's care.  If I can answer any additional questions, I would be pleased to do so.    Sincerely,    Serafina Topham K. Posey Pronto, DO

## 2015-03-05 NOTE — Patient Instructions (Addendum)
1.  You will need monthly blood tests for the first three months, then every 3 months 2.  Start riluzole 50mg  - take 1 tablet daily for one week, then increase to 1 tablet in the morning and bedtime 3.  Start home exercises within your own limitation.  If you choose to start occupational therapy, please let me know 4.  Referral to [facility]  Return to clinic in 55-month

## 2015-03-06 NOTE — Progress Notes (Signed)
Note routed to both. 

## 2015-03-09 ENCOUNTER — Ambulatory Visit: Payer: Medicare Other | Admitting: Neurology

## 2015-03-09 ENCOUNTER — Encounter: Payer: Self-pay | Admitting: Neurology

## 2015-03-11 ENCOUNTER — Encounter: Payer: Self-pay | Admitting: Neurology

## 2015-03-12 ENCOUNTER — Encounter: Payer: Self-pay | Admitting: Neurology

## 2015-03-16 ENCOUNTER — Encounter: Payer: Self-pay | Admitting: Neurology

## 2015-03-18 ENCOUNTER — Ambulatory Visit: Payer: Medicare Other | Admitting: Neurology

## 2015-03-31 DIAGNOSIS — Z882 Allergy status to sulfonamides status: Secondary | ICD-10-CM | POA: Diagnosis not present

## 2015-03-31 DIAGNOSIS — G1221 Amyotrophic lateral sclerosis: Secondary | ICD-10-CM | POA: Diagnosis not present

## 2015-03-31 DIAGNOSIS — I10 Essential (primary) hypertension: Secondary | ICD-10-CM | POA: Diagnosis not present

## 2015-03-31 DIAGNOSIS — Z79899 Other long term (current) drug therapy: Secondary | ICD-10-CM | POA: Diagnosis not present

## 2015-03-31 DIAGNOSIS — R531 Weakness: Secondary | ICD-10-CM | POA: Diagnosis not present

## 2015-03-31 DIAGNOSIS — Z7982 Long term (current) use of aspirin: Secondary | ICD-10-CM | POA: Diagnosis not present

## 2015-03-31 DIAGNOSIS — E785 Hyperlipidemia, unspecified: Secondary | ICD-10-CM | POA: Diagnosis not present

## 2015-04-29 DIAGNOSIS — I10 Essential (primary) hypertension: Secondary | ICD-10-CM | POA: Diagnosis not present

## 2015-04-29 DIAGNOSIS — G4701 Insomnia due to medical condition: Secondary | ICD-10-CM | POA: Diagnosis not present

## 2015-04-29 DIAGNOSIS — Z79899 Other long term (current) drug therapy: Secondary | ICD-10-CM | POA: Diagnosis not present

## 2015-04-29 DIAGNOSIS — G709 Myoneural disorder, unspecified: Secondary | ICD-10-CM | POA: Diagnosis not present

## 2015-04-29 DIAGNOSIS — Z882 Allergy status to sulfonamides status: Secondary | ICD-10-CM | POA: Diagnosis not present

## 2015-04-29 DIAGNOSIS — R531 Weakness: Secondary | ICD-10-CM | POA: Diagnosis not present

## 2015-04-29 DIAGNOSIS — Z7982 Long term (current) use of aspirin: Secondary | ICD-10-CM | POA: Diagnosis not present

## 2015-04-29 DIAGNOSIS — E785 Hyperlipidemia, unspecified: Secondary | ICD-10-CM | POA: Diagnosis not present

## 2015-04-29 DIAGNOSIS — G1221 Amyotrophic lateral sclerosis: Secondary | ICD-10-CM | POA: Diagnosis not present

## 2015-06-03 ENCOUNTER — Ambulatory Visit: Payer: Medicare Other | Admitting: Neurology

## 2015-09-30 DIAGNOSIS — M1990 Unspecified osteoarthritis, unspecified site: Secondary | ICD-10-CM | POA: Diagnosis not present

## 2015-09-30 DIAGNOSIS — Z85820 Personal history of malignant melanoma of skin: Secondary | ICD-10-CM | POA: Diagnosis not present

## 2015-09-30 DIAGNOSIS — R253 Fasciculation: Secondary | ICD-10-CM | POA: Diagnosis not present

## 2015-09-30 DIAGNOSIS — R531 Weakness: Secondary | ICD-10-CM | POA: Diagnosis not present

## 2015-09-30 DIAGNOSIS — E785 Hyperlipidemia, unspecified: Secondary | ICD-10-CM | POA: Diagnosis not present

## 2015-09-30 DIAGNOSIS — G1221 Amyotrophic lateral sclerosis: Secondary | ICD-10-CM | POA: Diagnosis not present

## 2015-09-30 DIAGNOSIS — G709 Myoneural disorder, unspecified: Secondary | ICD-10-CM | POA: Diagnosis not present

## 2015-09-30 DIAGNOSIS — I10 Essential (primary) hypertension: Secondary | ICD-10-CM | POA: Diagnosis not present

## 2015-11-20 DIAGNOSIS — G1221 Amyotrophic lateral sclerosis: Secondary | ICD-10-CM | POA: Diagnosis not present

## 2015-11-20 DIAGNOSIS — Z7189 Other specified counseling: Secondary | ICD-10-CM | POA: Diagnosis not present

## 2015-11-20 DIAGNOSIS — I10 Essential (primary) hypertension: Secondary | ICD-10-CM | POA: Diagnosis not present

## 2015-11-20 DIAGNOSIS — F329 Major depressive disorder, single episode, unspecified: Secondary | ICD-10-CM | POA: Diagnosis not present

## 2015-11-20 DIAGNOSIS — K5900 Constipation, unspecified: Secondary | ICD-10-CM | POA: Diagnosis not present

## 2015-11-27 DIAGNOSIS — Z7409 Other reduced mobility: Secondary | ICD-10-CM | POA: Diagnosis not present

## 2015-11-27 DIAGNOSIS — M6281 Muscle weakness (generalized): Secondary | ICD-10-CM | POA: Diagnosis not present

## 2015-11-27 DIAGNOSIS — G4700 Insomnia, unspecified: Secondary | ICD-10-CM | POA: Diagnosis not present

## 2015-11-30 DIAGNOSIS — R413 Other amnesia: Secondary | ICD-10-CM | POA: Diagnosis not present

## 2015-11-30 DIAGNOSIS — G1221 Amyotrophic lateral sclerosis: Secondary | ICD-10-CM | POA: Diagnosis not present

## 2015-11-30 DIAGNOSIS — M19041 Primary osteoarthritis, right hand: Secondary | ICD-10-CM | POA: Diagnosis not present

## 2015-11-30 DIAGNOSIS — F419 Anxiety disorder, unspecified: Secondary | ICD-10-CM | POA: Diagnosis not present

## 2015-11-30 DIAGNOSIS — M19042 Primary osteoarthritis, left hand: Secondary | ICD-10-CM | POA: Diagnosis not present

## 2015-11-30 DIAGNOSIS — G4700 Insomnia, unspecified: Secondary | ICD-10-CM | POA: Diagnosis not present

## 2015-12-01 DIAGNOSIS — G4700 Insomnia, unspecified: Secondary | ICD-10-CM | POA: Diagnosis not present

## 2015-12-01 DIAGNOSIS — M19042 Primary osteoarthritis, left hand: Secondary | ICD-10-CM | POA: Diagnosis not present

## 2015-12-01 DIAGNOSIS — F419 Anxiety disorder, unspecified: Secondary | ICD-10-CM | POA: Diagnosis not present

## 2015-12-01 DIAGNOSIS — R413 Other amnesia: Secondary | ICD-10-CM | POA: Diagnosis not present

## 2015-12-01 DIAGNOSIS — M19041 Primary osteoarthritis, right hand: Secondary | ICD-10-CM | POA: Diagnosis not present

## 2015-12-01 DIAGNOSIS — G1221 Amyotrophic lateral sclerosis: Secondary | ICD-10-CM | POA: Diagnosis not present

## 2015-12-04 DIAGNOSIS — F419 Anxiety disorder, unspecified: Secondary | ICD-10-CM | POA: Diagnosis not present

## 2015-12-04 DIAGNOSIS — R413 Other amnesia: Secondary | ICD-10-CM | POA: Diagnosis not present

## 2015-12-04 DIAGNOSIS — M19042 Primary osteoarthritis, left hand: Secondary | ICD-10-CM | POA: Diagnosis not present

## 2015-12-04 DIAGNOSIS — G4700 Insomnia, unspecified: Secondary | ICD-10-CM | POA: Diagnosis not present

## 2015-12-04 DIAGNOSIS — G1221 Amyotrophic lateral sclerosis: Secondary | ICD-10-CM | POA: Diagnosis not present

## 2015-12-04 DIAGNOSIS — M19041 Primary osteoarthritis, right hand: Secondary | ICD-10-CM | POA: Diagnosis not present

## 2015-12-08 DIAGNOSIS — G4700 Insomnia, unspecified: Secondary | ICD-10-CM | POA: Diagnosis not present

## 2015-12-08 DIAGNOSIS — M19041 Primary osteoarthritis, right hand: Secondary | ICD-10-CM | POA: Diagnosis not present

## 2015-12-08 DIAGNOSIS — R413 Other amnesia: Secondary | ICD-10-CM | POA: Diagnosis not present

## 2015-12-08 DIAGNOSIS — G1221 Amyotrophic lateral sclerosis: Secondary | ICD-10-CM | POA: Diagnosis not present

## 2015-12-08 DIAGNOSIS — F419 Anxiety disorder, unspecified: Secondary | ICD-10-CM | POA: Diagnosis not present

## 2015-12-08 DIAGNOSIS — M19042 Primary osteoarthritis, left hand: Secondary | ICD-10-CM | POA: Diagnosis not present

## 2015-12-11 DIAGNOSIS — G935 Compression of brain: Secondary | ICD-10-CM | POA: Diagnosis not present

## 2015-12-11 DIAGNOSIS — S065X0A Traumatic subdural hemorrhage without loss of consciousness, initial encounter: Secondary | ICD-10-CM | POA: Diagnosis not present

## 2015-12-11 DIAGNOSIS — S0990XA Unspecified injury of head, initial encounter: Secondary | ICD-10-CM | POA: Diagnosis not present

## 2015-12-11 DIAGNOSIS — G1221 Amyotrophic lateral sclerosis: Secondary | ICD-10-CM | POA: Diagnosis not present

## 2015-12-11 DIAGNOSIS — W19XXXA Unspecified fall, initial encounter: Secondary | ICD-10-CM | POA: Diagnosis not present

## 2015-12-11 DIAGNOSIS — R296 Repeated falls: Secondary | ICD-10-CM | POA: Diagnosis not present

## 2015-12-11 DIAGNOSIS — S0191XA Laceration without foreign body of unspecified part of head, initial encounter: Secondary | ICD-10-CM | POA: Diagnosis not present

## 2015-12-11 DIAGNOSIS — I10 Essential (primary) hypertension: Secondary | ICD-10-CM | POA: Diagnosis not present

## 2015-12-11 DIAGNOSIS — R54 Age-related physical debility: Secondary | ICD-10-CM | POA: Diagnosis not present

## 2015-12-11 DIAGNOSIS — I6200 Nontraumatic subdural hemorrhage, unspecified: Secondary | ICD-10-CM | POA: Diagnosis not present

## 2015-12-11 DIAGNOSIS — G4700 Insomnia, unspecified: Secondary | ICD-10-CM | POA: Diagnosis present

## 2015-12-11 DIAGNOSIS — S065X9A Traumatic subdural hemorrhage with loss of consciousness of unspecified duration, initial encounter: Secondary | ICD-10-CM | POA: Diagnosis not present

## 2015-12-11 DIAGNOSIS — E785 Hyperlipidemia, unspecified: Secondary | ICD-10-CM | POA: Diagnosis not present

## 2015-12-15 DIAGNOSIS — G1221 Amyotrophic lateral sclerosis: Secondary | ICD-10-CM | POA: Diagnosis not present

## 2015-12-15 DIAGNOSIS — R413 Other amnesia: Secondary | ICD-10-CM | POA: Diagnosis not present

## 2015-12-15 DIAGNOSIS — G4700 Insomnia, unspecified: Secondary | ICD-10-CM | POA: Diagnosis not present

## 2015-12-15 DIAGNOSIS — M19041 Primary osteoarthritis, right hand: Secondary | ICD-10-CM | POA: Diagnosis not present

## 2015-12-15 DIAGNOSIS — F419 Anxiety disorder, unspecified: Secondary | ICD-10-CM | POA: Diagnosis not present

## 2015-12-15 DIAGNOSIS — M19042 Primary osteoarthritis, left hand: Secondary | ICD-10-CM | POA: Diagnosis not present

## 2015-12-16 DIAGNOSIS — M19042 Primary osteoarthritis, left hand: Secondary | ICD-10-CM | POA: Diagnosis not present

## 2015-12-16 DIAGNOSIS — M19041 Primary osteoarthritis, right hand: Secondary | ICD-10-CM | POA: Diagnosis not present

## 2015-12-16 DIAGNOSIS — F419 Anxiety disorder, unspecified: Secondary | ICD-10-CM | POA: Diagnosis not present

## 2015-12-16 DIAGNOSIS — R413 Other amnesia: Secondary | ICD-10-CM | POA: Diagnosis not present

## 2015-12-16 DIAGNOSIS — G1221 Amyotrophic lateral sclerosis: Secondary | ICD-10-CM | POA: Diagnosis not present

## 2015-12-16 DIAGNOSIS — G4700 Insomnia, unspecified: Secondary | ICD-10-CM | POA: Diagnosis not present

## 2015-12-18 DIAGNOSIS — G1221 Amyotrophic lateral sclerosis: Secondary | ICD-10-CM | POA: Diagnosis not present

## 2015-12-18 DIAGNOSIS — R413 Other amnesia: Secondary | ICD-10-CM | POA: Diagnosis not present

## 2015-12-18 DIAGNOSIS — G4700 Insomnia, unspecified: Secondary | ICD-10-CM | POA: Diagnosis not present

## 2015-12-18 DIAGNOSIS — M19041 Primary osteoarthritis, right hand: Secondary | ICD-10-CM | POA: Diagnosis not present

## 2015-12-18 DIAGNOSIS — F419 Anxiety disorder, unspecified: Secondary | ICD-10-CM | POA: Diagnosis not present

## 2015-12-18 DIAGNOSIS — M19042 Primary osteoarthritis, left hand: Secondary | ICD-10-CM | POA: Diagnosis not present

## 2015-12-21 DIAGNOSIS — I10 Essential (primary) hypertension: Secondary | ICD-10-CM | POA: Diagnosis not present

## 2015-12-21 DIAGNOSIS — E785 Hyperlipidemia, unspecified: Secondary | ICD-10-CM | POA: Diagnosis not present

## 2015-12-21 DIAGNOSIS — S065X9S Traumatic subdural hemorrhage with loss of consciousness of unspecified duration, sequela: Secondary | ICD-10-CM | POA: Diagnosis not present

## 2015-12-21 DIAGNOSIS — R51 Headache: Secondary | ICD-10-CM | POA: Diagnosis not present

## 2015-12-21 DIAGNOSIS — G1221 Amyotrophic lateral sclerosis: Secondary | ICD-10-CM | POA: Diagnosis not present

## 2015-12-21 DIAGNOSIS — G4700 Insomnia, unspecified: Secondary | ICD-10-CM | POA: Diagnosis not present

## 2015-12-21 DIAGNOSIS — R54 Age-related physical debility: Secondary | ICD-10-CM | POA: Diagnosis not present

## 2015-12-22 DIAGNOSIS — I6201 Nontraumatic acute subdural hemorrhage: Secondary | ICD-10-CM | POA: Diagnosis not present

## 2015-12-22 DIAGNOSIS — I6200 Nontraumatic subdural hemorrhage, unspecified: Secondary | ICD-10-CM | POA: Diagnosis not present

## 2015-12-22 DIAGNOSIS — R54 Age-related physical debility: Secondary | ICD-10-CM | POA: Diagnosis not present

## 2015-12-22 DIAGNOSIS — G1221 Amyotrophic lateral sclerosis: Secondary | ICD-10-CM | POA: Diagnosis not present

## 2015-12-22 DIAGNOSIS — W1830XA Fall on same level, unspecified, initial encounter: Secondary | ICD-10-CM | POA: Diagnosis not present

## 2015-12-25 DIAGNOSIS — M19042 Primary osteoarthritis, left hand: Secondary | ICD-10-CM | POA: Diagnosis not present

## 2015-12-25 DIAGNOSIS — G4700 Insomnia, unspecified: Secondary | ICD-10-CM | POA: Diagnosis not present

## 2015-12-25 DIAGNOSIS — M19041 Primary osteoarthritis, right hand: Secondary | ICD-10-CM | POA: Diagnosis not present

## 2015-12-25 DIAGNOSIS — S065X0A Traumatic subdural hemorrhage without loss of consciousness, initial encounter: Secondary | ICD-10-CM | POA: Diagnosis not present

## 2015-12-25 DIAGNOSIS — G1221 Amyotrophic lateral sclerosis: Secondary | ICD-10-CM | POA: Diagnosis not present

## 2015-12-25 DIAGNOSIS — F419 Anxiety disorder, unspecified: Secondary | ICD-10-CM | POA: Diagnosis not present

## 2015-12-25 DIAGNOSIS — R296 Repeated falls: Secondary | ICD-10-CM | POA: Diagnosis not present

## 2015-12-25 DIAGNOSIS — R52 Pain, unspecified: Secondary | ICD-10-CM | POA: Diagnosis not present

## 2015-12-25 DIAGNOSIS — R413 Other amnesia: Secondary | ICD-10-CM | POA: Diagnosis not present

## 2015-12-28 DIAGNOSIS — I609 Nontraumatic subarachnoid hemorrhage, unspecified: Secondary | ICD-10-CM | POA: Diagnosis not present

## 2015-12-30 DIAGNOSIS — M19041 Primary osteoarthritis, right hand: Secondary | ICD-10-CM | POA: Diagnosis not present

## 2015-12-30 DIAGNOSIS — M19042 Primary osteoarthritis, left hand: Secondary | ICD-10-CM | POA: Diagnosis not present

## 2015-12-30 DIAGNOSIS — F419 Anxiety disorder, unspecified: Secondary | ICD-10-CM | POA: Diagnosis not present

## 2015-12-30 DIAGNOSIS — G4700 Insomnia, unspecified: Secondary | ICD-10-CM | POA: Diagnosis not present

## 2015-12-30 DIAGNOSIS — G1221 Amyotrophic lateral sclerosis: Secondary | ICD-10-CM | POA: Diagnosis not present

## 2015-12-30 DIAGNOSIS — R413 Other amnesia: Secondary | ICD-10-CM | POA: Diagnosis not present

## 2015-12-31 DIAGNOSIS — R413 Other amnesia: Secondary | ICD-10-CM | POA: Diagnosis not present

## 2015-12-31 DIAGNOSIS — M19041 Primary osteoarthritis, right hand: Secondary | ICD-10-CM | POA: Diagnosis not present

## 2015-12-31 DIAGNOSIS — G4700 Insomnia, unspecified: Secondary | ICD-10-CM | POA: Diagnosis not present

## 2015-12-31 DIAGNOSIS — M19042 Primary osteoarthritis, left hand: Secondary | ICD-10-CM | POA: Diagnosis not present

## 2015-12-31 DIAGNOSIS — G1221 Amyotrophic lateral sclerosis: Secondary | ICD-10-CM | POA: Diagnosis not present

## 2015-12-31 DIAGNOSIS — F419 Anxiety disorder, unspecified: Secondary | ICD-10-CM | POA: Diagnosis not present

## 2016-01-01 DIAGNOSIS — R51 Headache: Secondary | ICD-10-CM | POA: Diagnosis not present

## 2016-01-01 DIAGNOSIS — G1221 Amyotrophic lateral sclerosis: Secondary | ICD-10-CM | POA: Diagnosis not present

## 2016-01-01 DIAGNOSIS — R52 Pain, unspecified: Secondary | ICD-10-CM | POA: Diagnosis not present

## 2016-01-05 DIAGNOSIS — S065X0A Traumatic subdural hemorrhage without loss of consciousness, initial encounter: Secondary | ICD-10-CM | POA: Diagnosis not present

## 2016-01-05 DIAGNOSIS — R279 Unspecified lack of coordination: Secondary | ICD-10-CM | POA: Diagnosis not present

## 2016-01-05 DIAGNOSIS — S0180XA Unspecified open wound of other part of head, initial encounter: Secondary | ICD-10-CM | POA: Diagnosis not present

## 2016-01-05 DIAGNOSIS — M6281 Muscle weakness (generalized): Secondary | ICD-10-CM | POA: Diagnosis not present

## 2016-01-05 DIAGNOSIS — G1221 Amyotrophic lateral sclerosis: Secondary | ICD-10-CM | POA: Diagnosis not present

## 2016-01-05 DIAGNOSIS — S0101XA Laceration without foreign body of scalp, initial encounter: Secondary | ICD-10-CM | POA: Diagnosis not present

## 2016-01-05 DIAGNOSIS — S0181XA Laceration without foreign body of other part of head, initial encounter: Secondary | ICD-10-CM | POA: Diagnosis not present

## 2016-01-05 DIAGNOSIS — S065X0D Traumatic subdural hemorrhage without loss of consciousness, subsequent encounter: Secondary | ICD-10-CM | POA: Diagnosis not present

## 2016-01-08 DIAGNOSIS — G4700 Insomnia, unspecified: Secondary | ICD-10-CM | POA: Diagnosis not present

## 2016-01-08 DIAGNOSIS — M19042 Primary osteoarthritis, left hand: Secondary | ICD-10-CM | POA: Diagnosis not present

## 2016-01-08 DIAGNOSIS — G1221 Amyotrophic lateral sclerosis: Secondary | ICD-10-CM | POA: Diagnosis not present

## 2016-01-08 DIAGNOSIS — R413 Other amnesia: Secondary | ICD-10-CM | POA: Diagnosis not present

## 2016-01-08 DIAGNOSIS — F419 Anxiety disorder, unspecified: Secondary | ICD-10-CM | POA: Diagnosis not present

## 2016-01-08 DIAGNOSIS — M19041 Primary osteoarthritis, right hand: Secondary | ICD-10-CM | POA: Diagnosis not present

## 2016-01-14 DIAGNOSIS — M19041 Primary osteoarthritis, right hand: Secondary | ICD-10-CM | POA: Diagnosis not present

## 2016-01-14 DIAGNOSIS — M19042 Primary osteoarthritis, left hand: Secondary | ICD-10-CM | POA: Diagnosis not present

## 2016-01-14 DIAGNOSIS — G1221 Amyotrophic lateral sclerosis: Secondary | ICD-10-CM | POA: Diagnosis not present

## 2016-01-14 DIAGNOSIS — G4700 Insomnia, unspecified: Secondary | ICD-10-CM | POA: Diagnosis not present

## 2016-01-14 DIAGNOSIS — F419 Anxiety disorder, unspecified: Secondary | ICD-10-CM | POA: Diagnosis not present

## 2016-01-14 DIAGNOSIS — R413 Other amnesia: Secondary | ICD-10-CM | POA: Diagnosis not present

## 2016-01-15 DIAGNOSIS — G4700 Insomnia, unspecified: Secondary | ICD-10-CM | POA: Diagnosis not present

## 2016-01-15 DIAGNOSIS — G1221 Amyotrophic lateral sclerosis: Secondary | ICD-10-CM | POA: Diagnosis not present

## 2016-01-15 DIAGNOSIS — F419 Anxiety disorder, unspecified: Secondary | ICD-10-CM | POA: Diagnosis not present

## 2016-01-15 DIAGNOSIS — R413 Other amnesia: Secondary | ICD-10-CM | POA: Diagnosis not present

## 2016-01-15 DIAGNOSIS — M19042 Primary osteoarthritis, left hand: Secondary | ICD-10-CM | POA: Diagnosis not present

## 2016-01-15 DIAGNOSIS — M19041 Primary osteoarthritis, right hand: Secondary | ICD-10-CM | POA: Diagnosis not present

## 2016-01-30 DIAGNOSIS — S0191XA Laceration without foreign body of unspecified part of head, initial encounter: Secondary | ICD-10-CM | POA: Diagnosis not present

## 2016-01-30 DIAGNOSIS — S2231XA Fracture of one rib, right side, initial encounter for closed fracture: Secondary | ICD-10-CM | POA: Diagnosis not present

## 2016-01-30 DIAGNOSIS — E785 Hyperlipidemia, unspecified: Secondary | ICD-10-CM | POA: Diagnosis not present

## 2016-01-30 DIAGNOSIS — G4700 Insomnia, unspecified: Secondary | ICD-10-CM | POA: Diagnosis not present

## 2016-01-30 DIAGNOSIS — Z79899 Other long term (current) drug therapy: Secondary | ICD-10-CM | POA: Diagnosis not present

## 2016-01-30 DIAGNOSIS — S065X0A Traumatic subdural hemorrhage without loss of consciousness, initial encounter: Secondary | ICD-10-CM | POA: Diagnosis not present

## 2016-01-30 DIAGNOSIS — S0101XA Laceration without foreign body of scalp, initial encounter: Secondary | ICD-10-CM | POA: Diagnosis not present

## 2016-01-30 DIAGNOSIS — M25511 Pain in right shoulder: Secondary | ICD-10-CM | POA: Diagnosis not present

## 2016-01-30 DIAGNOSIS — I10 Essential (primary) hypertension: Secondary | ICD-10-CM | POA: Diagnosis not present

## 2016-01-30 DIAGNOSIS — J939 Pneumothorax, unspecified: Secondary | ICD-10-CM | POA: Diagnosis not present

## 2016-01-30 DIAGNOSIS — G1221 Amyotrophic lateral sclerosis: Secondary | ICD-10-CM | POA: Diagnosis not present

## 2016-01-31 DIAGNOSIS — J939 Pneumothorax, unspecified: Secondary | ICD-10-CM | POA: Diagnosis not present

## 2016-01-31 DIAGNOSIS — J189 Pneumonia, unspecified organism: Secondary | ICD-10-CM | POA: Diagnosis not present

## 2016-01-31 DIAGNOSIS — S2231XD Fracture of one rib, right side, subsequent encounter for fracture with routine healing: Secondary | ICD-10-CM | POA: Diagnosis not present

## 2016-01-31 DIAGNOSIS — Z7401 Bed confinement status: Secondary | ICD-10-CM | POA: Diagnosis not present

## 2016-02-05 DIAGNOSIS — R52 Pain, unspecified: Secondary | ICD-10-CM | POA: Diagnosis not present

## 2016-02-05 DIAGNOSIS — F419 Anxiety disorder, unspecified: Secondary | ICD-10-CM | POA: Diagnosis not present

## 2016-02-05 DIAGNOSIS — J939 Pneumothorax, unspecified: Secondary | ICD-10-CM | POA: Diagnosis not present

## 2016-02-05 DIAGNOSIS — S0101XA Laceration without foreign body of scalp, initial encounter: Secondary | ICD-10-CM | POA: Diagnosis not present

## 2016-02-05 DIAGNOSIS — S2231XA Fracture of one rib, right side, initial encounter for closed fracture: Secondary | ICD-10-CM | POA: Diagnosis not present

## 2016-02-06 DIAGNOSIS — J189 Pneumonia, unspecified organism: Secondary | ICD-10-CM | POA: Diagnosis not present

## 2016-02-08 DIAGNOSIS — R079 Chest pain, unspecified: Secondary | ICD-10-CM | POA: Diagnosis not present

## 2016-02-12 DIAGNOSIS — S2231XA Fracture of one rib, right side, initial encounter for closed fracture: Secondary | ICD-10-CM | POA: Diagnosis not present

## 2016-02-12 DIAGNOSIS — Z4802 Encounter for removal of sutures: Secondary | ICD-10-CM | POA: Diagnosis not present

## 2016-02-19 DIAGNOSIS — G1221 Amyotrophic lateral sclerosis: Secondary | ICD-10-CM | POA: Diagnosis not present

## 2016-02-19 DIAGNOSIS — R296 Repeated falls: Secondary | ICD-10-CM | POA: Diagnosis not present

## 2016-02-19 DIAGNOSIS — M6281 Muscle weakness (generalized): Secondary | ICD-10-CM | POA: Diagnosis not present

## 2016-02-20 DIAGNOSIS — R51 Headache: Secondary | ICD-10-CM | POA: Diagnosis not present

## 2016-02-20 DIAGNOSIS — M542 Cervicalgia: Secondary | ICD-10-CM | POA: Diagnosis not present

## 2016-02-20 DIAGNOSIS — S06349A Traumatic hemorrhage of right cerebrum with loss of consciousness of unspecified duration, initial encounter: Secondary | ICD-10-CM | POA: Diagnosis not present

## 2016-02-20 DIAGNOSIS — S065X9A Traumatic subdural hemorrhage with loss of consciousness of unspecified duration, initial encounter: Secondary | ICD-10-CM | POA: Diagnosis not present

## 2016-02-20 DIAGNOSIS — S06359A Traumatic hemorrhage of left cerebrum with loss of consciousness of unspecified duration, initial encounter: Secondary | ICD-10-CM | POA: Diagnosis not present

## 2016-02-22 DIAGNOSIS — G4700 Insomnia, unspecified: Secondary | ICD-10-CM | POA: Diagnosis not present

## 2016-02-22 DIAGNOSIS — Z9181 History of falling: Secondary | ICD-10-CM | POA: Diagnosis not present

## 2016-02-22 DIAGNOSIS — G1221 Amyotrophic lateral sclerosis: Secondary | ICD-10-CM | POA: Diagnosis not present

## 2016-02-22 DIAGNOSIS — F419 Anxiety disorder, unspecified: Secondary | ICD-10-CM | POA: Diagnosis not present

## 2016-02-22 DIAGNOSIS — R413 Other amnesia: Secondary | ICD-10-CM | POA: Diagnosis not present

## 2016-02-22 DIAGNOSIS — I10 Essential (primary) hypertension: Secondary | ICD-10-CM | POA: Diagnosis not present

## 2016-02-25 DIAGNOSIS — F419 Anxiety disorder, unspecified: Secondary | ICD-10-CM | POA: Diagnosis not present

## 2016-02-25 DIAGNOSIS — G4700 Insomnia, unspecified: Secondary | ICD-10-CM | POA: Diagnosis not present

## 2016-02-25 DIAGNOSIS — Z9181 History of falling: Secondary | ICD-10-CM | POA: Diagnosis not present

## 2016-02-25 DIAGNOSIS — R413 Other amnesia: Secondary | ICD-10-CM | POA: Diagnosis not present

## 2016-02-25 DIAGNOSIS — G1221 Amyotrophic lateral sclerosis: Secondary | ICD-10-CM | POA: Diagnosis not present

## 2016-02-25 DIAGNOSIS — I10 Essential (primary) hypertension: Secondary | ICD-10-CM | POA: Diagnosis not present

## 2016-03-01 DIAGNOSIS — I10 Essential (primary) hypertension: Secondary | ICD-10-CM | POA: Diagnosis not present

## 2016-03-01 DIAGNOSIS — F419 Anxiety disorder, unspecified: Secondary | ICD-10-CM | POA: Diagnosis not present

## 2016-03-01 DIAGNOSIS — G4700 Insomnia, unspecified: Secondary | ICD-10-CM | POA: Diagnosis not present

## 2016-03-01 DIAGNOSIS — Z9181 History of falling: Secondary | ICD-10-CM | POA: Diagnosis not present

## 2016-03-01 DIAGNOSIS — R413 Other amnesia: Secondary | ICD-10-CM | POA: Diagnosis not present

## 2016-03-01 DIAGNOSIS — G1221 Amyotrophic lateral sclerosis: Secondary | ICD-10-CM | POA: Diagnosis not present

## 2016-03-03 DIAGNOSIS — Z9181 History of falling: Secondary | ICD-10-CM | POA: Diagnosis not present

## 2016-03-03 DIAGNOSIS — R413 Other amnesia: Secondary | ICD-10-CM | POA: Diagnosis not present

## 2016-03-03 DIAGNOSIS — G4700 Insomnia, unspecified: Secondary | ICD-10-CM | POA: Diagnosis not present

## 2016-03-03 DIAGNOSIS — G1221 Amyotrophic lateral sclerosis: Secondary | ICD-10-CM | POA: Diagnosis not present

## 2016-03-03 DIAGNOSIS — F419 Anxiety disorder, unspecified: Secondary | ICD-10-CM | POA: Diagnosis not present

## 2016-03-03 DIAGNOSIS — I10 Essential (primary) hypertension: Secondary | ICD-10-CM | POA: Diagnosis not present

## 2016-03-08 DIAGNOSIS — F419 Anxiety disorder, unspecified: Secondary | ICD-10-CM | POA: Diagnosis not present

## 2016-03-08 DIAGNOSIS — G1221 Amyotrophic lateral sclerosis: Secondary | ICD-10-CM | POA: Diagnosis not present

## 2016-03-08 DIAGNOSIS — G4700 Insomnia, unspecified: Secondary | ICD-10-CM | POA: Diagnosis not present

## 2016-03-08 DIAGNOSIS — R413 Other amnesia: Secondary | ICD-10-CM | POA: Diagnosis not present

## 2016-03-08 DIAGNOSIS — Z9181 History of falling: Secondary | ICD-10-CM | POA: Diagnosis not present

## 2016-03-08 DIAGNOSIS — I10 Essential (primary) hypertension: Secondary | ICD-10-CM | POA: Diagnosis not present

## 2016-03-14 DIAGNOSIS — I10 Essential (primary) hypertension: Secondary | ICD-10-CM | POA: Diagnosis not present

## 2016-03-14 DIAGNOSIS — F419 Anxiety disorder, unspecified: Secondary | ICD-10-CM | POA: Diagnosis not present

## 2016-03-14 DIAGNOSIS — G1221 Amyotrophic lateral sclerosis: Secondary | ICD-10-CM | POA: Diagnosis not present

## 2016-03-14 DIAGNOSIS — G4700 Insomnia, unspecified: Secondary | ICD-10-CM | POA: Diagnosis not present

## 2016-03-14 DIAGNOSIS — Z9181 History of falling: Secondary | ICD-10-CM | POA: Diagnosis not present

## 2016-03-14 DIAGNOSIS — R413 Other amnesia: Secondary | ICD-10-CM | POA: Diagnosis not present

## 2016-03-16 DIAGNOSIS — Z9181 History of falling: Secondary | ICD-10-CM | POA: Diagnosis not present

## 2016-03-16 DIAGNOSIS — G4700 Insomnia, unspecified: Secondary | ICD-10-CM | POA: Diagnosis not present

## 2016-03-16 DIAGNOSIS — G1221 Amyotrophic lateral sclerosis: Secondary | ICD-10-CM | POA: Diagnosis not present

## 2016-03-16 DIAGNOSIS — F419 Anxiety disorder, unspecified: Secondary | ICD-10-CM | POA: Diagnosis not present

## 2016-03-16 DIAGNOSIS — R413 Other amnesia: Secondary | ICD-10-CM | POA: Diagnosis not present

## 2016-03-16 DIAGNOSIS — I10 Essential (primary) hypertension: Secondary | ICD-10-CM | POA: Diagnosis not present

## 2016-03-18 DIAGNOSIS — L853 Xerosis cutis: Secondary | ICD-10-CM | POA: Diagnosis not present

## 2016-03-24 DIAGNOSIS — G1221 Amyotrophic lateral sclerosis: Secondary | ICD-10-CM | POA: Diagnosis not present

## 2016-03-24 DIAGNOSIS — G4700 Insomnia, unspecified: Secondary | ICD-10-CM | POA: Diagnosis not present

## 2016-03-24 DIAGNOSIS — I10 Essential (primary) hypertension: Secondary | ICD-10-CM | POA: Diagnosis not present

## 2016-03-24 DIAGNOSIS — Z9181 History of falling: Secondary | ICD-10-CM | POA: Diagnosis not present

## 2016-03-24 DIAGNOSIS — Z760 Encounter for issue of repeat prescription: Secondary | ICD-10-CM | POA: Diagnosis not present

## 2016-03-24 DIAGNOSIS — R52 Pain, unspecified: Secondary | ICD-10-CM | POA: Diagnosis not present

## 2016-03-24 DIAGNOSIS — R413 Other amnesia: Secondary | ICD-10-CM | POA: Diagnosis not present

## 2016-03-24 DIAGNOSIS — F419 Anxiety disorder, unspecified: Secondary | ICD-10-CM | POA: Diagnosis not present

## 2016-03-25 DIAGNOSIS — L853 Xerosis cutis: Secondary | ICD-10-CM | POA: Diagnosis not present

## 2016-03-25 DIAGNOSIS — R2681 Unsteadiness on feet: Secondary | ICD-10-CM | POA: Diagnosis not present

## 2016-03-29 DIAGNOSIS — G4700 Insomnia, unspecified: Secondary | ICD-10-CM | POA: Diagnosis not present

## 2016-03-29 DIAGNOSIS — F329 Major depressive disorder, single episode, unspecified: Secondary | ICD-10-CM | POA: Diagnosis not present

## 2016-03-29 DIAGNOSIS — R51 Headache: Secondary | ICD-10-CM | POA: Diagnosis not present

## 2016-03-29 DIAGNOSIS — I10 Essential (primary) hypertension: Secondary | ICD-10-CM | POA: Diagnosis not present

## 2016-03-29 DIAGNOSIS — E785 Hyperlipidemia, unspecified: Secondary | ICD-10-CM | POA: Diagnosis not present

## 2016-03-29 DIAGNOSIS — G8100 Flaccid hemiplegia affecting unspecified side: Secondary | ICD-10-CM | POA: Diagnosis not present

## 2016-03-29 DIAGNOSIS — Z882 Allergy status to sulfonamides status: Secondary | ICD-10-CM | POA: Diagnosis not present

## 2016-03-29 DIAGNOSIS — I619 Nontraumatic intracerebral hemorrhage, unspecified: Secondary | ICD-10-CM | POA: Diagnosis not present

## 2016-03-29 DIAGNOSIS — R279 Unspecified lack of coordination: Secondary | ICD-10-CM | POA: Diagnosis not present

## 2016-03-29 DIAGNOSIS — M542 Cervicalgia: Secondary | ICD-10-CM | POA: Diagnosis not present

## 2016-03-29 DIAGNOSIS — G1221 Amyotrophic lateral sclerosis: Secondary | ICD-10-CM | POA: Diagnosis not present

## 2016-03-30 DIAGNOSIS — R410 Disorientation, unspecified: Secondary | ICD-10-CM | POA: Diagnosis not present

## 2016-04-01 DIAGNOSIS — Z87820 Personal history of traumatic brain injury: Secondary | ICD-10-CM | POA: Diagnosis not present

## 2016-04-01 DIAGNOSIS — I10 Essential (primary) hypertension: Secondary | ICD-10-CM | POA: Diagnosis not present

## 2016-04-01 DIAGNOSIS — G1221 Amyotrophic lateral sclerosis: Secondary | ICD-10-CM | POA: Diagnosis not present

## 2016-04-01 DIAGNOSIS — R1310 Dysphagia, unspecified: Secondary | ICD-10-CM | POA: Diagnosis not present

## 2016-04-01 DIAGNOSIS — R51 Headache: Secondary | ICD-10-CM | POA: Diagnosis not present

## 2016-04-01 DIAGNOSIS — R296 Repeated falls: Secondary | ICD-10-CM | POA: Diagnosis not present

## 2016-04-02 DIAGNOSIS — I10 Essential (primary) hypertension: Secondary | ICD-10-CM | POA: Diagnosis not present

## 2016-04-02 DIAGNOSIS — Z87820 Personal history of traumatic brain injury: Secondary | ICD-10-CM | POA: Diagnosis not present

## 2016-04-02 DIAGNOSIS — R1310 Dysphagia, unspecified: Secondary | ICD-10-CM | POA: Diagnosis not present

## 2016-04-02 DIAGNOSIS — G1221 Amyotrophic lateral sclerosis: Secondary | ICD-10-CM | POA: Diagnosis not present

## 2016-04-03 DIAGNOSIS — G1221 Amyotrophic lateral sclerosis: Secondary | ICD-10-CM | POA: Diagnosis not present

## 2016-04-03 DIAGNOSIS — R1310 Dysphagia, unspecified: Secondary | ICD-10-CM | POA: Diagnosis not present

## 2016-04-03 DIAGNOSIS — Z87820 Personal history of traumatic brain injury: Secondary | ICD-10-CM | POA: Diagnosis not present

## 2016-04-03 DIAGNOSIS — I10 Essential (primary) hypertension: Secondary | ICD-10-CM | POA: Diagnosis not present

## 2016-04-04 DIAGNOSIS — Z87820 Personal history of traumatic brain injury: Secondary | ICD-10-CM | POA: Diagnosis not present

## 2016-04-04 DIAGNOSIS — I10 Essential (primary) hypertension: Secondary | ICD-10-CM | POA: Diagnosis not present

## 2016-04-04 DIAGNOSIS — R1310 Dysphagia, unspecified: Secondary | ICD-10-CM | POA: Diagnosis not present

## 2016-04-04 DIAGNOSIS — G1221 Amyotrophic lateral sclerosis: Secondary | ICD-10-CM | POA: Diagnosis not present

## 2016-04-05 DIAGNOSIS — F419 Anxiety disorder, unspecified: Secondary | ICD-10-CM | POA: Diagnosis not present

## 2016-04-05 DIAGNOSIS — R413 Other amnesia: Secondary | ICD-10-CM | POA: Diagnosis not present

## 2016-04-05 DIAGNOSIS — Z87820 Personal history of traumatic brain injury: Secondary | ICD-10-CM | POA: Diagnosis not present

## 2016-04-05 DIAGNOSIS — I10 Essential (primary) hypertension: Secondary | ICD-10-CM | POA: Diagnosis not present

## 2016-04-05 DIAGNOSIS — G1221 Amyotrophic lateral sclerosis: Secondary | ICD-10-CM | POA: Diagnosis not present

## 2016-04-05 DIAGNOSIS — R1310 Dysphagia, unspecified: Secondary | ICD-10-CM | POA: Diagnosis not present

## 2016-04-06 DIAGNOSIS — Z87820 Personal history of traumatic brain injury: Secondary | ICD-10-CM | POA: Diagnosis not present

## 2016-04-06 DIAGNOSIS — I10 Essential (primary) hypertension: Secondary | ICD-10-CM | POA: Diagnosis not present

## 2016-04-06 DIAGNOSIS — R1310 Dysphagia, unspecified: Secondary | ICD-10-CM | POA: Diagnosis not present

## 2016-04-06 DIAGNOSIS — G1221 Amyotrophic lateral sclerosis: Secondary | ICD-10-CM | POA: Diagnosis not present

## 2016-04-07 DIAGNOSIS — I10 Essential (primary) hypertension: Secondary | ICD-10-CM | POA: Diagnosis not present

## 2016-04-07 DIAGNOSIS — Z87820 Personal history of traumatic brain injury: Secondary | ICD-10-CM | POA: Diagnosis not present

## 2016-04-07 DIAGNOSIS — G1221 Amyotrophic lateral sclerosis: Secondary | ICD-10-CM | POA: Diagnosis not present

## 2016-04-07 DIAGNOSIS — R1310 Dysphagia, unspecified: Secondary | ICD-10-CM | POA: Diagnosis not present

## 2016-04-08 DIAGNOSIS — Z87820 Personal history of traumatic brain injury: Secondary | ICD-10-CM | POA: Diagnosis not present

## 2016-04-08 DIAGNOSIS — I10 Essential (primary) hypertension: Secondary | ICD-10-CM | POA: Diagnosis not present

## 2016-04-08 DIAGNOSIS — R1310 Dysphagia, unspecified: Secondary | ICD-10-CM | POA: Diagnosis not present

## 2016-04-08 DIAGNOSIS — G1221 Amyotrophic lateral sclerosis: Secondary | ICD-10-CM | POA: Diagnosis not present

## 2016-04-11 DIAGNOSIS — G1221 Amyotrophic lateral sclerosis: Secondary | ICD-10-CM | POA: Diagnosis not present

## 2016-04-11 DIAGNOSIS — I10 Essential (primary) hypertension: Secondary | ICD-10-CM | POA: Diagnosis not present

## 2016-04-11 DIAGNOSIS — Z87820 Personal history of traumatic brain injury: Secondary | ICD-10-CM | POA: Diagnosis not present

## 2016-04-11 DIAGNOSIS — R1310 Dysphagia, unspecified: Secondary | ICD-10-CM | POA: Diagnosis not present

## 2016-04-13 DIAGNOSIS — R1310 Dysphagia, unspecified: Secondary | ICD-10-CM | POA: Diagnosis not present

## 2016-04-13 DIAGNOSIS — G1221 Amyotrophic lateral sclerosis: Secondary | ICD-10-CM | POA: Diagnosis not present

## 2016-04-13 DIAGNOSIS — Z87820 Personal history of traumatic brain injury: Secondary | ICD-10-CM | POA: Diagnosis not present

## 2016-04-13 DIAGNOSIS — I10 Essential (primary) hypertension: Secondary | ICD-10-CM | POA: Diagnosis not present

## 2016-04-14 DIAGNOSIS — G1221 Amyotrophic lateral sclerosis: Secondary | ICD-10-CM | POA: Diagnosis not present

## 2016-04-14 DIAGNOSIS — R1310 Dysphagia, unspecified: Secondary | ICD-10-CM | POA: Diagnosis not present

## 2016-04-14 DIAGNOSIS — I10 Essential (primary) hypertension: Secondary | ICD-10-CM | POA: Diagnosis not present

## 2016-04-14 DIAGNOSIS — Z87820 Personal history of traumatic brain injury: Secondary | ICD-10-CM | POA: Diagnosis not present

## 2016-04-15 DIAGNOSIS — Z87820 Personal history of traumatic brain injury: Secondary | ICD-10-CM | POA: Diagnosis not present

## 2016-04-15 DIAGNOSIS — G1221 Amyotrophic lateral sclerosis: Secondary | ICD-10-CM | POA: Diagnosis not present

## 2016-04-15 DIAGNOSIS — R1310 Dysphagia, unspecified: Secondary | ICD-10-CM | POA: Diagnosis not present

## 2016-04-15 DIAGNOSIS — I10 Essential (primary) hypertension: Secondary | ICD-10-CM | POA: Diagnosis not present

## 2016-04-18 DIAGNOSIS — G1221 Amyotrophic lateral sclerosis: Secondary | ICD-10-CM | POA: Diagnosis not present

## 2016-04-18 DIAGNOSIS — Z87820 Personal history of traumatic brain injury: Secondary | ICD-10-CM | POA: Diagnosis not present

## 2016-04-18 DIAGNOSIS — I10 Essential (primary) hypertension: Secondary | ICD-10-CM | POA: Diagnosis not present

## 2016-04-18 DIAGNOSIS — R1310 Dysphagia, unspecified: Secondary | ICD-10-CM | POA: Diagnosis not present

## 2016-04-19 DIAGNOSIS — G1221 Amyotrophic lateral sclerosis: Secondary | ICD-10-CM | POA: Diagnosis not present

## 2016-04-19 DIAGNOSIS — Z87820 Personal history of traumatic brain injury: Secondary | ICD-10-CM | POA: Diagnosis not present

## 2016-04-19 DIAGNOSIS — I10 Essential (primary) hypertension: Secondary | ICD-10-CM | POA: Diagnosis not present

## 2016-04-19 DIAGNOSIS — R1310 Dysphagia, unspecified: Secondary | ICD-10-CM | POA: Diagnosis not present

## 2016-04-20 DIAGNOSIS — I10 Essential (primary) hypertension: Secondary | ICD-10-CM | POA: Diagnosis not present

## 2016-04-20 DIAGNOSIS — R1310 Dysphagia, unspecified: Secondary | ICD-10-CM | POA: Diagnosis not present

## 2016-04-20 DIAGNOSIS — Z87820 Personal history of traumatic brain injury: Secondary | ICD-10-CM | POA: Diagnosis not present

## 2016-04-20 DIAGNOSIS — G1221 Amyotrophic lateral sclerosis: Secondary | ICD-10-CM | POA: Diagnosis not present

## 2016-04-22 DIAGNOSIS — R1310 Dysphagia, unspecified: Secondary | ICD-10-CM | POA: Diagnosis not present

## 2016-04-22 DIAGNOSIS — I10 Essential (primary) hypertension: Secondary | ICD-10-CM | POA: Diagnosis not present

## 2016-04-22 DIAGNOSIS — Z87820 Personal history of traumatic brain injury: Secondary | ICD-10-CM | POA: Diagnosis not present

## 2016-04-22 DIAGNOSIS — G1221 Amyotrophic lateral sclerosis: Secondary | ICD-10-CM | POA: Diagnosis not present

## 2016-04-24 DIAGNOSIS — I10 Essential (primary) hypertension: Secondary | ICD-10-CM | POA: Diagnosis not present

## 2016-04-24 DIAGNOSIS — Z87820 Personal history of traumatic brain injury: Secondary | ICD-10-CM | POA: Diagnosis not present

## 2016-04-24 DIAGNOSIS — R1310 Dysphagia, unspecified: Secondary | ICD-10-CM | POA: Diagnosis not present

## 2016-04-24 DIAGNOSIS — G1221 Amyotrophic lateral sclerosis: Secondary | ICD-10-CM | POA: Diagnosis not present

## 2016-04-25 DIAGNOSIS — G1221 Amyotrophic lateral sclerosis: Secondary | ICD-10-CM | POA: Diagnosis not present

## 2016-04-25 DIAGNOSIS — I10 Essential (primary) hypertension: Secondary | ICD-10-CM | POA: Diagnosis not present

## 2016-04-25 DIAGNOSIS — R1310 Dysphagia, unspecified: Secondary | ICD-10-CM | POA: Diagnosis not present

## 2016-04-25 DIAGNOSIS — Z87820 Personal history of traumatic brain injury: Secondary | ICD-10-CM | POA: Diagnosis not present

## 2016-04-26 DIAGNOSIS — G1221 Amyotrophic lateral sclerosis: Secondary | ICD-10-CM | POA: Diagnosis not present

## 2016-04-26 DIAGNOSIS — Z87820 Personal history of traumatic brain injury: Secondary | ICD-10-CM | POA: Diagnosis not present

## 2016-04-26 DIAGNOSIS — R1310 Dysphagia, unspecified: Secondary | ICD-10-CM | POA: Diagnosis not present

## 2016-04-26 DIAGNOSIS — I10 Essential (primary) hypertension: Secondary | ICD-10-CM | POA: Diagnosis not present

## 2016-04-27 DIAGNOSIS — G1221 Amyotrophic lateral sclerosis: Secondary | ICD-10-CM | POA: Diagnosis not present

## 2016-04-27 DIAGNOSIS — R1310 Dysphagia, unspecified: Secondary | ICD-10-CM | POA: Diagnosis not present

## 2016-04-27 DIAGNOSIS — Z87820 Personal history of traumatic brain injury: Secondary | ICD-10-CM | POA: Diagnosis not present

## 2016-04-27 DIAGNOSIS — I10 Essential (primary) hypertension: Secondary | ICD-10-CM | POA: Diagnosis not present

## 2016-04-29 DIAGNOSIS — G1221 Amyotrophic lateral sclerosis: Secondary | ICD-10-CM | POA: Diagnosis not present

## 2016-04-29 DIAGNOSIS — I10 Essential (primary) hypertension: Secondary | ICD-10-CM | POA: Diagnosis not present

## 2016-04-29 DIAGNOSIS — Z87820 Personal history of traumatic brain injury: Secondary | ICD-10-CM | POA: Diagnosis not present

## 2016-04-29 DIAGNOSIS — R1310 Dysphagia, unspecified: Secondary | ICD-10-CM | POA: Diagnosis not present

## 2016-05-02 DIAGNOSIS — I10 Essential (primary) hypertension: Secondary | ICD-10-CM | POA: Diagnosis not present

## 2016-05-02 DIAGNOSIS — G1221 Amyotrophic lateral sclerosis: Secondary | ICD-10-CM | POA: Diagnosis not present

## 2016-05-02 DIAGNOSIS — R1310 Dysphagia, unspecified: Secondary | ICD-10-CM | POA: Diagnosis not present

## 2016-05-02 DIAGNOSIS — Z87820 Personal history of traumatic brain injury: Secondary | ICD-10-CM | POA: Diagnosis not present

## 2016-05-04 DIAGNOSIS — R1310 Dysphagia, unspecified: Secondary | ICD-10-CM | POA: Diagnosis not present

## 2016-05-04 DIAGNOSIS — I10 Essential (primary) hypertension: Secondary | ICD-10-CM | POA: Diagnosis not present

## 2016-05-04 DIAGNOSIS — G1221 Amyotrophic lateral sclerosis: Secondary | ICD-10-CM | POA: Diagnosis not present

## 2016-05-04 DIAGNOSIS — Z87820 Personal history of traumatic brain injury: Secondary | ICD-10-CM | POA: Diagnosis not present

## 2016-05-06 DIAGNOSIS — G1221 Amyotrophic lateral sclerosis: Secondary | ICD-10-CM | POA: Diagnosis not present

## 2016-05-06 DIAGNOSIS — I10 Essential (primary) hypertension: Secondary | ICD-10-CM | POA: Diagnosis not present

## 2016-05-06 DIAGNOSIS — R1310 Dysphagia, unspecified: Secondary | ICD-10-CM | POA: Diagnosis not present

## 2016-05-06 DIAGNOSIS — Z87820 Personal history of traumatic brain injury: Secondary | ICD-10-CM | POA: Diagnosis not present

## 2016-05-09 DIAGNOSIS — I10 Essential (primary) hypertension: Secondary | ICD-10-CM | POA: Diagnosis not present

## 2016-05-09 DIAGNOSIS — R1310 Dysphagia, unspecified: Secondary | ICD-10-CM | POA: Diagnosis not present

## 2016-05-09 DIAGNOSIS — Z87820 Personal history of traumatic brain injury: Secondary | ICD-10-CM | POA: Diagnosis not present

## 2016-05-09 DIAGNOSIS — G1221 Amyotrophic lateral sclerosis: Secondary | ICD-10-CM | POA: Diagnosis not present

## 2016-05-11 DIAGNOSIS — G1221 Amyotrophic lateral sclerosis: Secondary | ICD-10-CM | POA: Diagnosis not present

## 2016-05-11 DIAGNOSIS — I10 Essential (primary) hypertension: Secondary | ICD-10-CM | POA: Diagnosis not present

## 2016-05-11 DIAGNOSIS — Z87820 Personal history of traumatic brain injury: Secondary | ICD-10-CM | POA: Diagnosis not present

## 2016-05-11 DIAGNOSIS — R1310 Dysphagia, unspecified: Secondary | ICD-10-CM | POA: Diagnosis not present

## 2016-05-12 DIAGNOSIS — I10 Essential (primary) hypertension: Secondary | ICD-10-CM | POA: Diagnosis not present

## 2016-05-12 DIAGNOSIS — G1221 Amyotrophic lateral sclerosis: Secondary | ICD-10-CM | POA: Diagnosis not present

## 2016-05-12 DIAGNOSIS — R1310 Dysphagia, unspecified: Secondary | ICD-10-CM | POA: Diagnosis not present

## 2016-05-12 DIAGNOSIS — Z87820 Personal history of traumatic brain injury: Secondary | ICD-10-CM | POA: Diagnosis not present

## 2016-05-13 DIAGNOSIS — G1221 Amyotrophic lateral sclerosis: Secondary | ICD-10-CM | POA: Diagnosis not present

## 2016-05-13 DIAGNOSIS — R1310 Dysphagia, unspecified: Secondary | ICD-10-CM | POA: Diagnosis not present

## 2016-05-13 DIAGNOSIS — Z87820 Personal history of traumatic brain injury: Secondary | ICD-10-CM | POA: Diagnosis not present

## 2016-05-13 DIAGNOSIS — I10 Essential (primary) hypertension: Secondary | ICD-10-CM | POA: Diagnosis not present

## 2016-05-16 DIAGNOSIS — R1310 Dysphagia, unspecified: Secondary | ICD-10-CM | POA: Diagnosis not present

## 2016-05-16 DIAGNOSIS — Z87820 Personal history of traumatic brain injury: Secondary | ICD-10-CM | POA: Diagnosis not present

## 2016-05-16 DIAGNOSIS — G1221 Amyotrophic lateral sclerosis: Secondary | ICD-10-CM | POA: Diagnosis not present

## 2016-05-16 DIAGNOSIS — I10 Essential (primary) hypertension: Secondary | ICD-10-CM | POA: Diagnosis not present

## 2016-05-18 DIAGNOSIS — R1310 Dysphagia, unspecified: Secondary | ICD-10-CM | POA: Diagnosis not present

## 2016-05-18 DIAGNOSIS — I10 Essential (primary) hypertension: Secondary | ICD-10-CM | POA: Diagnosis not present

## 2016-05-18 DIAGNOSIS — Z87820 Personal history of traumatic brain injury: Secondary | ICD-10-CM | POA: Diagnosis not present

## 2016-05-18 DIAGNOSIS — G1221 Amyotrophic lateral sclerosis: Secondary | ICD-10-CM | POA: Diagnosis not present

## 2016-05-20 DIAGNOSIS — I10 Essential (primary) hypertension: Secondary | ICD-10-CM | POA: Diagnosis not present

## 2016-05-20 DIAGNOSIS — Z87820 Personal history of traumatic brain injury: Secondary | ICD-10-CM | POA: Diagnosis not present

## 2016-05-20 DIAGNOSIS — G1221 Amyotrophic lateral sclerosis: Secondary | ICD-10-CM | POA: Diagnosis not present

## 2016-05-20 DIAGNOSIS — R1310 Dysphagia, unspecified: Secondary | ICD-10-CM | POA: Diagnosis not present

## 2016-05-23 DIAGNOSIS — G1221 Amyotrophic lateral sclerosis: Secondary | ICD-10-CM | POA: Diagnosis not present

## 2016-05-23 DIAGNOSIS — I10 Essential (primary) hypertension: Secondary | ICD-10-CM | POA: Diagnosis not present

## 2016-05-23 DIAGNOSIS — R1310 Dysphagia, unspecified: Secondary | ICD-10-CM | POA: Diagnosis not present

## 2016-05-23 DIAGNOSIS — Z87820 Personal history of traumatic brain injury: Secondary | ICD-10-CM | POA: Diagnosis not present

## 2016-05-24 DIAGNOSIS — R1310 Dysphagia, unspecified: Secondary | ICD-10-CM | POA: Diagnosis not present

## 2016-05-24 DIAGNOSIS — Z87820 Personal history of traumatic brain injury: Secondary | ICD-10-CM | POA: Diagnosis not present

## 2016-05-24 DIAGNOSIS — I10 Essential (primary) hypertension: Secondary | ICD-10-CM | POA: Diagnosis not present

## 2016-05-24 DIAGNOSIS — G1221 Amyotrophic lateral sclerosis: Secondary | ICD-10-CM | POA: Diagnosis not present

## 2016-05-25 DIAGNOSIS — G1221 Amyotrophic lateral sclerosis: Secondary | ICD-10-CM | POA: Diagnosis not present

## 2016-05-25 DIAGNOSIS — R1310 Dysphagia, unspecified: Secondary | ICD-10-CM | POA: Diagnosis not present

## 2016-05-25 DIAGNOSIS — Z87820 Personal history of traumatic brain injury: Secondary | ICD-10-CM | POA: Diagnosis not present

## 2016-05-25 DIAGNOSIS — I10 Essential (primary) hypertension: Secondary | ICD-10-CM | POA: Diagnosis not present

## 2016-05-26 DIAGNOSIS — G1221 Amyotrophic lateral sclerosis: Secondary | ICD-10-CM | POA: Diagnosis not present

## 2016-05-26 DIAGNOSIS — R1310 Dysphagia, unspecified: Secondary | ICD-10-CM | POA: Diagnosis not present

## 2016-05-26 DIAGNOSIS — Z87820 Personal history of traumatic brain injury: Secondary | ICD-10-CM | POA: Diagnosis not present

## 2016-05-26 DIAGNOSIS — I10 Essential (primary) hypertension: Secondary | ICD-10-CM | POA: Diagnosis not present

## 2016-05-27 DIAGNOSIS — M6281 Muscle weakness (generalized): Secondary | ICD-10-CM | POA: Diagnosis not present

## 2016-05-27 DIAGNOSIS — S0191XA Laceration without foreign body of unspecified part of head, initial encounter: Secondary | ICD-10-CM | POA: Diagnosis not present

## 2016-05-27 DIAGNOSIS — Z87820 Personal history of traumatic brain injury: Secondary | ICD-10-CM | POA: Diagnosis not present

## 2016-05-27 DIAGNOSIS — S0101XA Laceration without foreign body of scalp, initial encounter: Secondary | ICD-10-CM | POA: Diagnosis not present

## 2016-05-27 DIAGNOSIS — R1310 Dysphagia, unspecified: Secondary | ICD-10-CM | POA: Diagnosis not present

## 2016-05-27 DIAGNOSIS — I10 Essential (primary) hypertension: Secondary | ICD-10-CM | POA: Diagnosis not present

## 2016-05-27 DIAGNOSIS — F329 Major depressive disorder, single episode, unspecified: Secondary | ICD-10-CM | POA: Diagnosis not present

## 2016-05-27 DIAGNOSIS — E785 Hyperlipidemia, unspecified: Secondary | ICD-10-CM | POA: Diagnosis not present

## 2016-05-27 DIAGNOSIS — G1221 Amyotrophic lateral sclerosis: Secondary | ICD-10-CM | POA: Diagnosis not present

## 2016-05-27 DIAGNOSIS — R279 Unspecified lack of coordination: Secondary | ICD-10-CM | POA: Diagnosis not present

## 2016-05-30 DIAGNOSIS — Z87820 Personal history of traumatic brain injury: Secondary | ICD-10-CM | POA: Diagnosis not present

## 2016-05-30 DIAGNOSIS — R1310 Dysphagia, unspecified: Secondary | ICD-10-CM | POA: Diagnosis not present

## 2016-05-30 DIAGNOSIS — G1221 Amyotrophic lateral sclerosis: Secondary | ICD-10-CM | POA: Diagnosis not present

## 2016-05-30 DIAGNOSIS — I10 Essential (primary) hypertension: Secondary | ICD-10-CM | POA: Diagnosis not present

## 2016-05-31 DIAGNOSIS — R1310 Dysphagia, unspecified: Secondary | ICD-10-CM | POA: Diagnosis not present

## 2016-05-31 DIAGNOSIS — Z87820 Personal history of traumatic brain injury: Secondary | ICD-10-CM | POA: Diagnosis not present

## 2016-05-31 DIAGNOSIS — I10 Essential (primary) hypertension: Secondary | ICD-10-CM | POA: Diagnosis not present

## 2016-05-31 DIAGNOSIS — G1221 Amyotrophic lateral sclerosis: Secondary | ICD-10-CM | POA: Diagnosis not present

## 2016-06-01 DIAGNOSIS — Z87820 Personal history of traumatic brain injury: Secondary | ICD-10-CM | POA: Diagnosis not present

## 2016-06-01 DIAGNOSIS — I10 Essential (primary) hypertension: Secondary | ICD-10-CM | POA: Diagnosis not present

## 2016-06-01 DIAGNOSIS — G1221 Amyotrophic lateral sclerosis: Secondary | ICD-10-CM | POA: Diagnosis not present

## 2016-06-01 DIAGNOSIS — R1310 Dysphagia, unspecified: Secondary | ICD-10-CM | POA: Diagnosis not present

## 2016-06-02 DIAGNOSIS — R1310 Dysphagia, unspecified: Secondary | ICD-10-CM | POA: Diagnosis not present

## 2016-06-02 DIAGNOSIS — I10 Essential (primary) hypertension: Secondary | ICD-10-CM | POA: Diagnosis not present

## 2016-06-02 DIAGNOSIS — Z87820 Personal history of traumatic brain injury: Secondary | ICD-10-CM | POA: Diagnosis not present

## 2016-06-02 DIAGNOSIS — G1221 Amyotrophic lateral sclerosis: Secondary | ICD-10-CM | POA: Diagnosis not present

## 2016-06-03 DIAGNOSIS — I10 Essential (primary) hypertension: Secondary | ICD-10-CM | POA: Diagnosis not present

## 2016-06-03 DIAGNOSIS — R1310 Dysphagia, unspecified: Secondary | ICD-10-CM | POA: Diagnosis not present

## 2016-06-03 DIAGNOSIS — Z87820 Personal history of traumatic brain injury: Secondary | ICD-10-CM | POA: Diagnosis not present

## 2016-06-03 DIAGNOSIS — G1221 Amyotrophic lateral sclerosis: Secondary | ICD-10-CM | POA: Diagnosis not present

## 2016-06-06 DIAGNOSIS — Z87820 Personal history of traumatic brain injury: Secondary | ICD-10-CM | POA: Diagnosis not present

## 2016-06-06 DIAGNOSIS — R1310 Dysphagia, unspecified: Secondary | ICD-10-CM | POA: Diagnosis not present

## 2016-06-06 DIAGNOSIS — I10 Essential (primary) hypertension: Secondary | ICD-10-CM | POA: Diagnosis not present

## 2016-06-06 DIAGNOSIS — G1221 Amyotrophic lateral sclerosis: Secondary | ICD-10-CM | POA: Diagnosis not present

## 2016-06-08 DIAGNOSIS — G1221 Amyotrophic lateral sclerosis: Secondary | ICD-10-CM | POA: Diagnosis not present

## 2016-06-08 DIAGNOSIS — Z87820 Personal history of traumatic brain injury: Secondary | ICD-10-CM | POA: Diagnosis not present

## 2016-06-08 DIAGNOSIS — I10 Essential (primary) hypertension: Secondary | ICD-10-CM | POA: Diagnosis not present

## 2016-06-08 DIAGNOSIS — R1310 Dysphagia, unspecified: Secondary | ICD-10-CM | POA: Diagnosis not present

## 2016-06-09 DIAGNOSIS — R1310 Dysphagia, unspecified: Secondary | ICD-10-CM | POA: Diagnosis not present

## 2016-06-09 DIAGNOSIS — I10 Essential (primary) hypertension: Secondary | ICD-10-CM | POA: Diagnosis not present

## 2016-06-09 DIAGNOSIS — G1221 Amyotrophic lateral sclerosis: Secondary | ICD-10-CM | POA: Diagnosis not present

## 2016-06-09 DIAGNOSIS — Z87820 Personal history of traumatic brain injury: Secondary | ICD-10-CM | POA: Diagnosis not present

## 2016-06-10 DIAGNOSIS — I10 Essential (primary) hypertension: Secondary | ICD-10-CM | POA: Diagnosis not present

## 2016-06-10 DIAGNOSIS — R1310 Dysphagia, unspecified: Secondary | ICD-10-CM | POA: Diagnosis not present

## 2016-06-10 DIAGNOSIS — G1221 Amyotrophic lateral sclerosis: Secondary | ICD-10-CM | POA: Diagnosis not present

## 2016-06-10 DIAGNOSIS — Z87820 Personal history of traumatic brain injury: Secondary | ICD-10-CM | POA: Diagnosis not present

## 2016-06-13 DIAGNOSIS — I10 Essential (primary) hypertension: Secondary | ICD-10-CM | POA: Diagnosis not present

## 2016-06-13 DIAGNOSIS — G1221 Amyotrophic lateral sclerosis: Secondary | ICD-10-CM | POA: Diagnosis not present

## 2016-06-13 DIAGNOSIS — R1310 Dysphagia, unspecified: Secondary | ICD-10-CM | POA: Diagnosis not present

## 2016-06-13 DIAGNOSIS — Z87820 Personal history of traumatic brain injury: Secondary | ICD-10-CM | POA: Diagnosis not present

## 2016-06-14 DIAGNOSIS — R1310 Dysphagia, unspecified: Secondary | ICD-10-CM | POA: Diagnosis not present

## 2016-06-14 DIAGNOSIS — Z87820 Personal history of traumatic brain injury: Secondary | ICD-10-CM | POA: Diagnosis not present

## 2016-06-14 DIAGNOSIS — I10 Essential (primary) hypertension: Secondary | ICD-10-CM | POA: Diagnosis not present

## 2016-06-14 DIAGNOSIS — G1221 Amyotrophic lateral sclerosis: Secondary | ICD-10-CM | POA: Diagnosis not present

## 2016-06-15 DIAGNOSIS — I10 Essential (primary) hypertension: Secondary | ICD-10-CM | POA: Diagnosis not present

## 2016-06-15 DIAGNOSIS — G1221 Amyotrophic lateral sclerosis: Secondary | ICD-10-CM | POA: Diagnosis not present

## 2016-06-15 DIAGNOSIS — R1310 Dysphagia, unspecified: Secondary | ICD-10-CM | POA: Diagnosis not present

## 2016-06-15 DIAGNOSIS — Z87820 Personal history of traumatic brain injury: Secondary | ICD-10-CM | POA: Diagnosis not present

## 2016-06-16 DIAGNOSIS — R1310 Dysphagia, unspecified: Secondary | ICD-10-CM | POA: Diagnosis not present

## 2016-06-16 DIAGNOSIS — Z87820 Personal history of traumatic brain injury: Secondary | ICD-10-CM | POA: Diagnosis not present

## 2016-06-16 DIAGNOSIS — I10 Essential (primary) hypertension: Secondary | ICD-10-CM | POA: Diagnosis not present

## 2016-06-16 DIAGNOSIS — G1221 Amyotrophic lateral sclerosis: Secondary | ICD-10-CM | POA: Diagnosis not present

## 2016-06-17 DIAGNOSIS — Z87820 Personal history of traumatic brain injury: Secondary | ICD-10-CM | POA: Diagnosis not present

## 2016-06-17 DIAGNOSIS — R1310 Dysphagia, unspecified: Secondary | ICD-10-CM | POA: Diagnosis not present

## 2016-06-17 DIAGNOSIS — I10 Essential (primary) hypertension: Secondary | ICD-10-CM | POA: Diagnosis not present

## 2016-06-17 DIAGNOSIS — G1221 Amyotrophic lateral sclerosis: Secondary | ICD-10-CM | POA: Diagnosis not present

## 2016-06-21 DIAGNOSIS — R1310 Dysphagia, unspecified: Secondary | ICD-10-CM | POA: Diagnosis not present

## 2016-06-21 DIAGNOSIS — G1221 Amyotrophic lateral sclerosis: Secondary | ICD-10-CM | POA: Diagnosis not present

## 2016-06-21 DIAGNOSIS — Z87820 Personal history of traumatic brain injury: Secondary | ICD-10-CM | POA: Diagnosis not present

## 2016-06-21 DIAGNOSIS — I10 Essential (primary) hypertension: Secondary | ICD-10-CM | POA: Diagnosis not present

## 2016-06-23 DIAGNOSIS — Z87820 Personal history of traumatic brain injury: Secondary | ICD-10-CM | POA: Diagnosis not present

## 2016-06-23 DIAGNOSIS — I10 Essential (primary) hypertension: Secondary | ICD-10-CM | POA: Diagnosis not present

## 2016-06-23 DIAGNOSIS — G1221 Amyotrophic lateral sclerosis: Secondary | ICD-10-CM | POA: Diagnosis not present

## 2016-06-23 DIAGNOSIS — R1310 Dysphagia, unspecified: Secondary | ICD-10-CM | POA: Diagnosis not present

## 2016-06-24 DIAGNOSIS — G1221 Amyotrophic lateral sclerosis: Secondary | ICD-10-CM | POA: Diagnosis not present

## 2016-06-24 DIAGNOSIS — R1310 Dysphagia, unspecified: Secondary | ICD-10-CM | POA: Diagnosis not present

## 2016-06-24 DIAGNOSIS — Z87820 Personal history of traumatic brain injury: Secondary | ICD-10-CM | POA: Diagnosis not present

## 2016-06-24 DIAGNOSIS — I10 Essential (primary) hypertension: Secondary | ICD-10-CM | POA: Diagnosis not present

## 2016-06-28 DIAGNOSIS — R1310 Dysphagia, unspecified: Secondary | ICD-10-CM | POA: Diagnosis not present

## 2016-06-28 DIAGNOSIS — G1221 Amyotrophic lateral sclerosis: Secondary | ICD-10-CM | POA: Diagnosis not present

## 2016-06-28 DIAGNOSIS — I10 Essential (primary) hypertension: Secondary | ICD-10-CM | POA: Diagnosis not present

## 2016-06-28 DIAGNOSIS — Z87820 Personal history of traumatic brain injury: Secondary | ICD-10-CM | POA: Diagnosis not present

## 2016-06-29 DIAGNOSIS — Z87820 Personal history of traumatic brain injury: Secondary | ICD-10-CM | POA: Diagnosis not present

## 2016-06-29 DIAGNOSIS — I10 Essential (primary) hypertension: Secondary | ICD-10-CM | POA: Diagnosis not present

## 2016-06-29 DIAGNOSIS — G1221 Amyotrophic lateral sclerosis: Secondary | ICD-10-CM | POA: Diagnosis not present

## 2016-06-29 DIAGNOSIS — R1310 Dysphagia, unspecified: Secondary | ICD-10-CM | POA: Diagnosis not present

## 2016-06-30 DIAGNOSIS — G1221 Amyotrophic lateral sclerosis: Secondary | ICD-10-CM | POA: Diagnosis not present

## 2016-06-30 DIAGNOSIS — R1310 Dysphagia, unspecified: Secondary | ICD-10-CM | POA: Diagnosis not present

## 2016-06-30 DIAGNOSIS — Z87820 Personal history of traumatic brain injury: Secondary | ICD-10-CM | POA: Diagnosis not present

## 2016-06-30 DIAGNOSIS — I10 Essential (primary) hypertension: Secondary | ICD-10-CM | POA: Diagnosis not present

## 2016-07-01 DIAGNOSIS — I10 Essential (primary) hypertension: Secondary | ICD-10-CM | POA: Diagnosis not present

## 2016-07-01 DIAGNOSIS — Z87820 Personal history of traumatic brain injury: Secondary | ICD-10-CM | POA: Diagnosis not present

## 2016-07-01 DIAGNOSIS — R1310 Dysphagia, unspecified: Secondary | ICD-10-CM | POA: Diagnosis not present

## 2016-07-01 DIAGNOSIS — G1221 Amyotrophic lateral sclerosis: Secondary | ICD-10-CM | POA: Diagnosis not present

## 2016-07-02 DIAGNOSIS — Z87820 Personal history of traumatic brain injury: Secondary | ICD-10-CM | POA: Diagnosis not present

## 2016-07-02 DIAGNOSIS — G1221 Amyotrophic lateral sclerosis: Secondary | ICD-10-CM | POA: Diagnosis not present

## 2016-07-02 DIAGNOSIS — I10 Essential (primary) hypertension: Secondary | ICD-10-CM | POA: Diagnosis not present

## 2016-07-02 DIAGNOSIS — R1310 Dysphagia, unspecified: Secondary | ICD-10-CM | POA: Diagnosis not present

## 2016-07-24 DEATH — deceased

## 2017-04-13 IMAGING — MR MR HEAD W/O CM
15 series · 48 of 48 positions shown · non-contrast
Comparison: None.

CLINICAL DATA: 69-year-old hypertensive male with left shoulder,
arm and hand numbness and weakness for 4 months. Fall 12/10/2014
hitting right shoulder and right-sided head. Initial encounter.

EXAM:
MRI HEAD WITHOUT CONTRAST
MRI CERVICAL SPINE WITHOUT CONTRAST
TECHNIQUE: Multiplanar, multiecho pulse sequences of the brain and surrounding
structures, and cervical spine, to include the craniocervical
junction and cervicothoracic junction, were obtained without
intravenous contrast.

[Series 5: T1 · sagittal · 4.0mm · 0.75mm/px · 3 of 31 slices shown (1 of 3)]
[im 1/31]
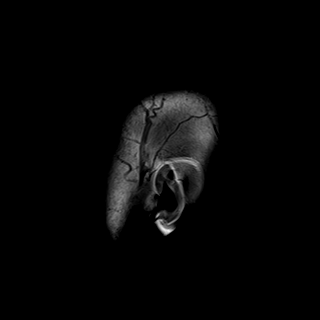
[im 16/31]
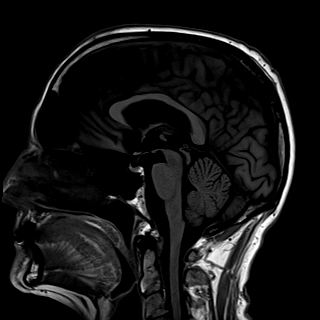
[im 31/31]
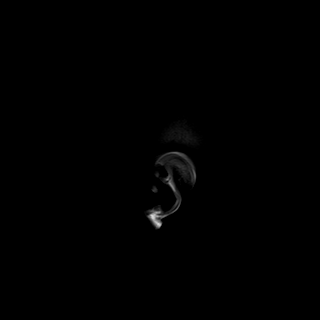

[Series 6: DWI · axial · 3.0mm · 1.44mm/px · z∈[-50,+89]mm · 6 of 88 slices shown (1 of 4)]
[im 1/88]
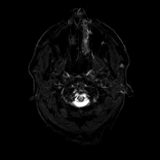
[im 18/88]
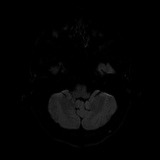
[im 35/88]
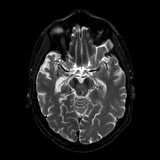
[im 53/88]
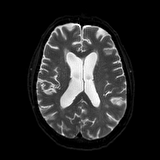
[im 70/88]
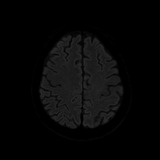
[im 88/88]
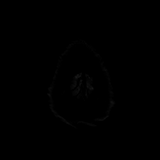

[Series 7: DWI · axial · 3.0mm · 1.44mm/px · z∈[-50,+89]mm · 3 of 44 slices shown (2 of 4)]
[im 1/44]
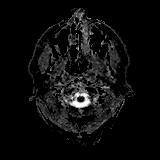
[im 22/44]
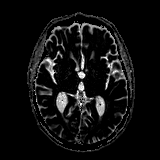
[im 44/44]
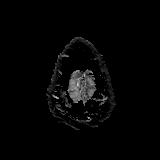

[Series 8: DWI · coronal · 5.0mm · 1.44mm/px · 4 of 60 slices shown (3 of 4)]
[im 1/60]
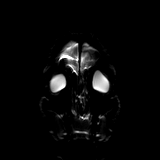
[im 20/60]
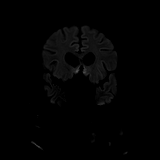
[im 40/60]
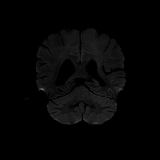
[im 60/60]
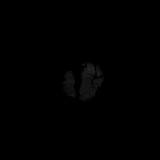

[Series 9: DWI · coronal · 5.0mm · 1.44mm/px · 2 of 30 slices shown (4 of 4)]
[im 1/30]
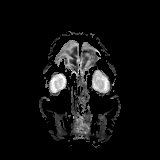
[im 30/30]
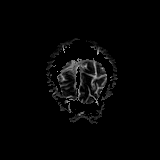

[Series 10: T2 · axial · 4.0mm · 0.36mm/px · z∈[-45,+93]mm · 2 of 28 slices shown (1 of 4)]
[im 1/28]
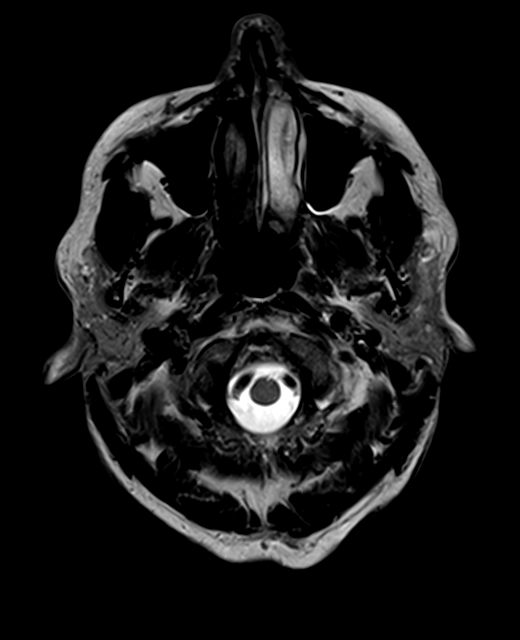
[im 28/28]
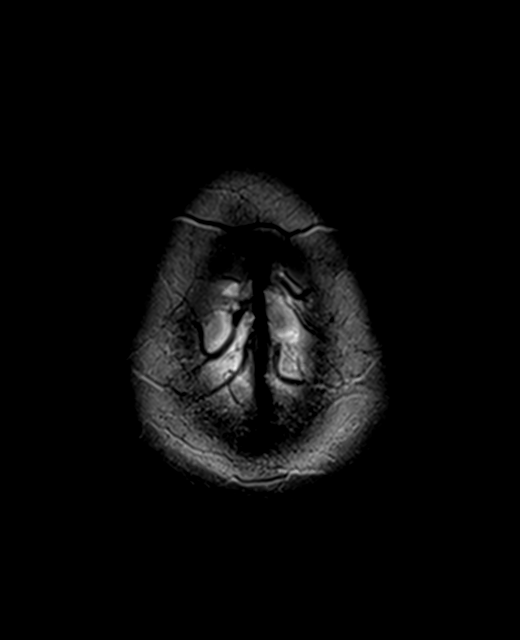

[Series 11: FLAIR · axial · 4.0mm · 0.72mm/px · z∈[-45,+93]mm · 2 of 28 slices shown]
[im 1/28]
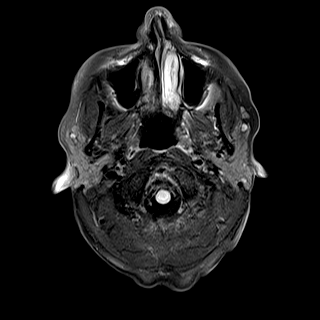
[im 28/28]
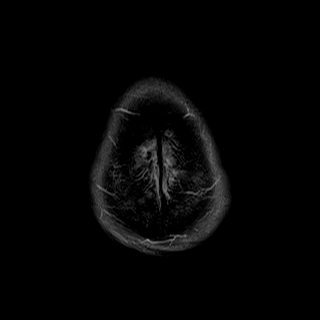

[Series 13: swi_images · axial · 1.5mm · 0.90mm/px · z∈[-46,+94]mm · 7 of 96 slices shown]
[im 1/96]
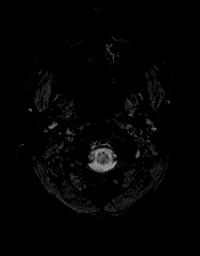
[im 16/96]
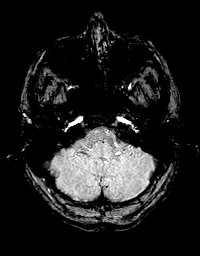
[im 32/96]
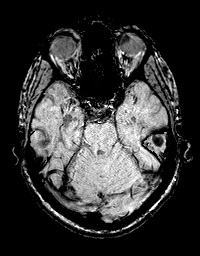
[im 48/96]
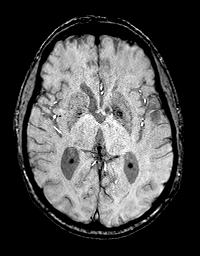
[im 64/96]
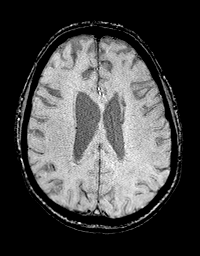
[im 80/96]
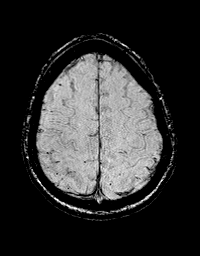
[im 96/96]
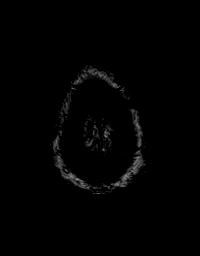

[Series 14: T1 · axial · 1.0mm · 0.90mm/px · z∈[-46,+95]mm · 10 of 144 slices shown (2 of 3)]
[im 1/144]
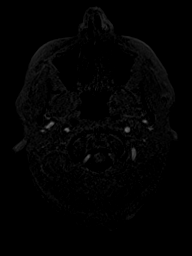
[im 16/144]
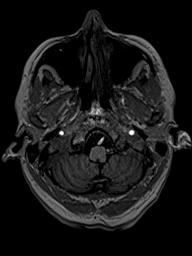
[im 32/144]
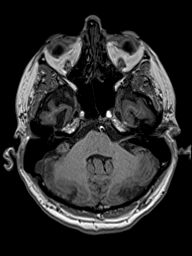
[im 48/144]
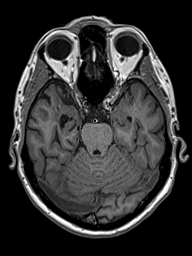
[im 64/144]
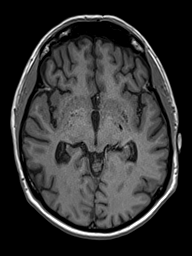
[im 80/144]
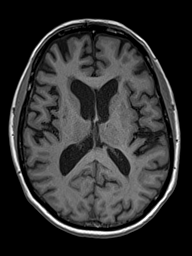
[im 96/144]
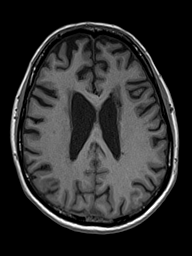
[im 112/144]
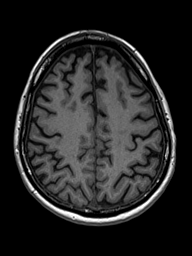
[im 128/144]
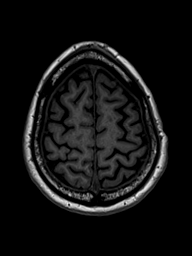
[im 144/144]
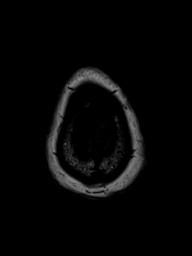

[Series 15: T2 · coronal · 4.5mm · 0.36mm/px · 2 of 30 slices shown (2 of 4)]
[im 1/30]
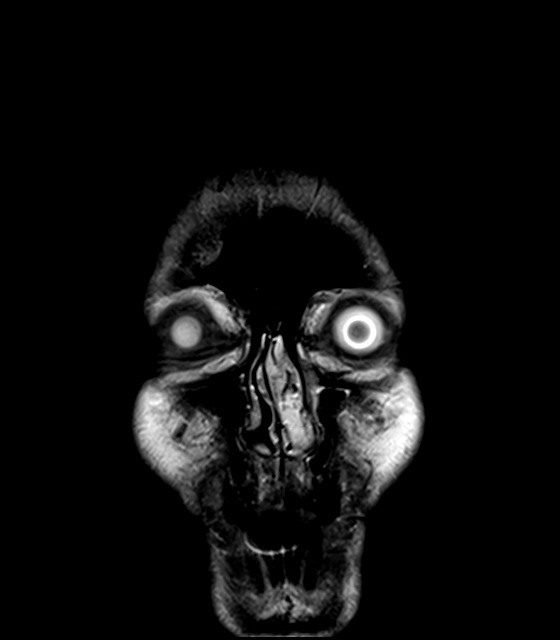
[im 30/30]
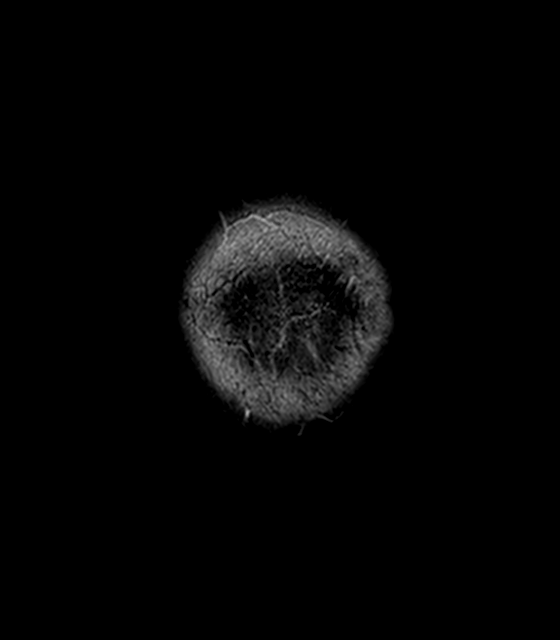

[Series 21: T1 · sagittal · 3.0mm · 0.66mm/px · 1 of 15 slices shown (3 of 3)]
[im 1/15]
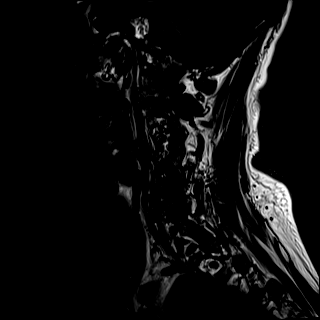

[Series 22: STIR · sagittal · 3.0mm · 0.33mm/px · 1 of 15 slices shown]
[im 1/15]
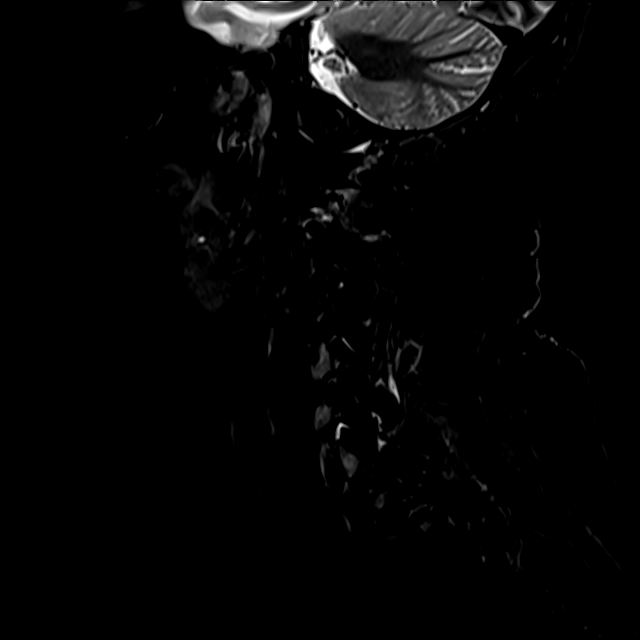

[Series 23: T2 · sagittal · 3.0mm · 0.55mm/px · 1 of 15 slices shown (3 of 4)]
[im 1/15]
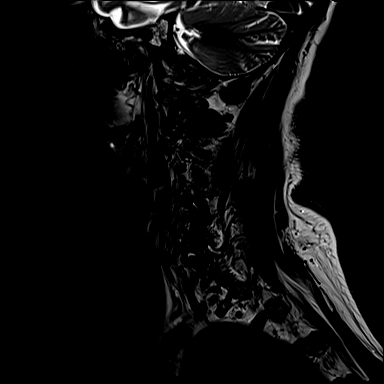

[Series 24: T2 · axial · 3.0mm · 0.50mm/px · z∈[-222,-124]mm · 2 of 32 slices shown (4 of 4)]
[im 1/32]
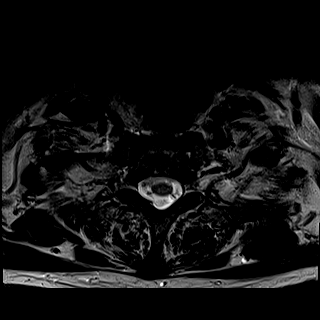
[im 32/32]
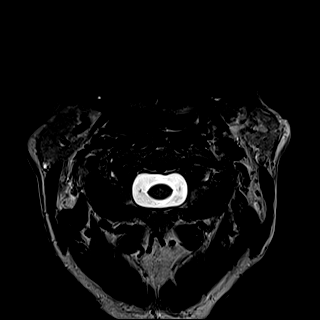

[Series 25: GRE · axial · 3.0mm · 0.42mm/px · z∈[-222,-124]mm · 2 of 32 slices shown]
[im 1/32]
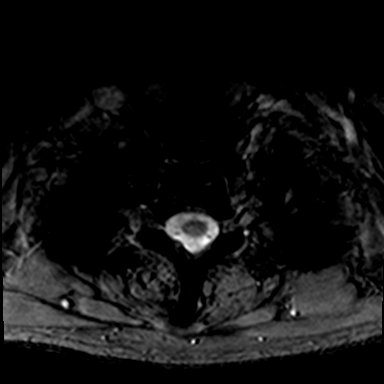
[im 32/32]
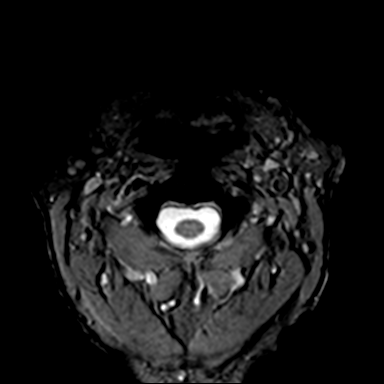

[48 of 48 positions shown; findings below may reference images not displayed]

FINDINGS: MRI HEAD FINDINGS

No acute infarct or intracranial hemorrhage.

Several small remote infarcts centrum semiovale bilaterally, left
periatrial region and left caudate. Mild chronic small vessel
disease changes.

Anterior right frontal lobe abnormality has an appearance of
encephalomalacia possibly related to prior trauma rather than remote
infarct. Slightly septated anterior right frontal horn felt to be an
incidental finding.

Global atrophy without hydrocephalus.

No intracranial mass lesion noted on this unenhanced exam.

Major intracranial vascular structures are patent.

Decreased signal intensity of bone marrow may be related to
patient's habitus. Correlation with CBC to exclude anemia
contributing to this appearance may be considered. Slightly
heterogeneous appearance of the clivus without focal lesion.

Cervical medullary junction, pituitary region, pineal region and
orbital structures unremarkable.

Minimal mucosal thickening ethmoid sinus air cells and maxillary
sinuses.

MRI CERVICAL SPINE FINDINGS

Cervical medullary junction within normal limits. No focal cervical
cord signal abnormality. Visualized paravertebral structures
unremarkable.

Reversal of the normal cervical lordosis with mild kyphosis centered
at the C4-5 level.

Mild degenerative changes C1-2.

C2-3:  Minimal bulge.

C3-4: Mild bulge. Mild narrowing ventral thecal sac. Minimal left
facet degenerative changes and minimal left foraminal narrowing.

C4-5: Broad-based disc osteophyte complex. Narrowing ventral thecal
sac with minimal cord contact without flattening. Uncinate
hypertrophy and facet degenerative changes. Moderate foraminal
narrowing greater on the left.

C5-6: Minimal bulge. Facet degenerative changes and uncinate
hypertrophy. Mild bilateral foraminal narrowing.

C6-7: Mild bulge slightly greater to left. Slight narrowing ventral
thecal sac. Mild facet degenerative changes. Buckling posterior
ligament.

C7-T1: Negative.
IMPRESSION: MRI HEAD

No acute infarct or intracranial hemorrhage.

Several small remote infarcts centrum semiovale bilaterally, left
periatrial region and left caudate. Mild chronic small vessel
disease changes.

Anterior right frontal lobe abnormality has an appearance of
encephalomalacia possibly related to prior trauma rather than remote
infarct.

Global atrophy without hydrocephalus.

Decreased signal intensity of bone marrow may be related to
patient's habitus. Correlation with CBC to exclude anemia
contributing to this appearance may be considered.

MRI CERVICAL SPINE

Cervical spondylotic changes most notable C4-5 level as detailed
above.
